# Patient Record
Sex: Male | Born: 1960 | Race: Black or African American | Hispanic: No | Marital: Married | Smoking: Never smoker
Health system: Southern US, Community
[De-identification: ages and names within clinical notes are randomized; demographics above are authoritative.]

## PROBLEM LIST (undated history)

## (undated) DIAGNOSIS — K08109 Complete loss of teeth, unspecified cause, unspecified class: Secondary | ICD-10-CM

## (undated) DIAGNOSIS — E785 Hyperlipidemia, unspecified: Secondary | ICD-10-CM

## (undated) DIAGNOSIS — N210 Calculus in bladder: Secondary | ICD-10-CM

## (undated) DIAGNOSIS — N200 Calculus of kidney: Secondary | ICD-10-CM

## (undated) DIAGNOSIS — Z87442 Personal history of urinary calculi: Secondary | ICD-10-CM

## (undated) DIAGNOSIS — R51 Headache: Secondary | ICD-10-CM

## (undated) DIAGNOSIS — E7800 Pure hypercholesterolemia, unspecified: Secondary | ICD-10-CM

## (undated) DIAGNOSIS — J302 Other seasonal allergic rhinitis: Secondary | ICD-10-CM

## (undated) HISTORY — PX: CARPAL TUNNEL RELEASE: SHX101

## (undated) HISTORY — PX: CIRCUMCISION: SUR203

---

## 2004-01-20 ENCOUNTER — Emergency Department (HOSPITAL_COMMUNITY): Admission: EM | Admit: 2004-01-20 | Discharge: 2004-01-20 | Payer: Self-pay | Admitting: *Deleted

## 2004-07-16 ENCOUNTER — Ambulatory Visit: Payer: Self-pay | Admitting: *Deleted

## 2004-07-16 ENCOUNTER — Ambulatory Visit (HOSPITAL_COMMUNITY): Admission: RE | Admit: 2004-07-16 | Discharge: 2004-07-16 | Payer: Self-pay | Admitting: Family Medicine

## 2005-06-06 ENCOUNTER — Emergency Department (HOSPITAL_COMMUNITY): Admission: EM | Admit: 2005-06-06 | Discharge: 2005-06-06 | Payer: Self-pay | Admitting: Emergency Medicine

## 2006-05-25 ENCOUNTER — Emergency Department (HOSPITAL_COMMUNITY): Admission: EM | Admit: 2006-05-25 | Discharge: 2006-05-25 | Payer: Self-pay | Admitting: Emergency Medicine

## 2006-06-05 ENCOUNTER — Encounter (HOSPITAL_COMMUNITY): Admission: RE | Admit: 2006-06-05 | Discharge: 2006-07-05 | Payer: Self-pay | Admitting: Family Medicine

## 2011-11-11 ENCOUNTER — Other Ambulatory Visit: Payer: Self-pay

## 2011-11-11 ENCOUNTER — Telehealth: Payer: Self-pay

## 2011-11-11 DIAGNOSIS — Z139 Encounter for screening, unspecified: Secondary | ICD-10-CM

## 2011-11-11 NOTE — Telephone Encounter (Signed)
Pt had called and left message for me to call [phone]. Called and [facility] for a return call.

## 2011-11-12 NOTE — Telephone Encounter (Addendum)
Gastroenterology Pre-Procedure Form    Request Date: 11/11/2011      Requesting Physician: Dr. Mirna Mires     PATIENT INFORMATION:  Julian Wilson is a 51 y.o., male (DOB=06/07/1960).  PROCEDURE: Procedure(s) requested: colonoscopy Procedure Reason: screening for colon cancer  PATIENT REVIEW QUESTIONS: The patient reports the following:   1. Diabetes Melitis: no 2. Joint replacements in the past 12 months: no 3. Major health problems in the past 3 months: no 4. Has an artificial valve or MVP:no 5. Has been advised in past to take antibiotics in advance of a procedure like teeth cleaning: no}    MEDICATIONS & ALLERGIES:    Patient reports the following regarding taking any blood thinners:   Plavix? no Aspirin?yes  Coumadin?  no  Patient confirms/reports the following medications:  Current Outpatient Prescriptions  Medication Sig Dispense Refill  . aspirin 81 MG tablet Take 81 mg by mouth daily.      . cetirizine (ZYRTEC) 10 MG tablet Take 10 mg by mouth daily. Pt takes either this or an OTC allergy pill      . simvastatin (ZOCOR) 40 MG tablet Take 40 mg by mouth every evening.        Patient confirms/reports the following allergies:  No Known Allergies  Patient is appropriate to schedule for requested procedure(s): yes  AUTHORIZATION INFORMATION Primary Insurance:   ID #:  Group #:  Pre-Cert / Auth required:  Pre-Cert / Auth #:   Secondary Insurance:   ID #: Group #:  Pre-Cert / Auth required:  Pre-Cert / Auth #:   No orders of the defined types were placed in this encounter.    SCHEDULE INFORMATION: Procedure has been scheduled as follows:  Date: 11/26/2011    Time: 12:30 PM  Location: [facility] Short Stay  This Gastroenterology Pre-Precedure Form is being routed to the following provider(s) for review: R. Roetta Sessions, MD    Rx and instructions mailed to pt.

## 2011-11-12 NOTE — Telephone Encounter (Signed)
OK for colonoscopy.  

## 2011-11-18 ENCOUNTER — Encounter (HOSPITAL_COMMUNITY): Payer: Self-pay | Admitting: Pharmacy Technician

## 2011-11-26 ENCOUNTER — Ambulatory Visit (HOSPITAL_COMMUNITY)
Admission: RE | Admit: 2011-11-26 | Discharge: 2011-11-27 | Disposition: A | Discharge status: Home / Self Care | Payer: PRIVATE HEALTH INSURANCE | Source: Ambulatory Visit | Attending: Internal Medicine | Admitting: Internal Medicine

## 2011-11-26 ENCOUNTER — Encounter (HOSPITAL_COMMUNITY): Payer: Self-pay | Admitting: *Deleted

## 2011-11-26 ENCOUNTER — Encounter (HOSPITAL_COMMUNITY)
Admission: RE | Disposition: A | Discharge status: Home / Self Care | Payer: Self-pay | Source: Ambulatory Visit | Attending: Internal Medicine

## 2011-11-26 DIAGNOSIS — Z1211 Encounter for screening for malignant neoplasm of colon: Secondary | ICD-10-CM

## 2011-11-26 DIAGNOSIS — E7800 Pure hypercholesterolemia, unspecified: Secondary | ICD-10-CM | POA: Insufficient documentation

## 2011-11-26 DIAGNOSIS — Z139 Encounter for screening, unspecified: Secondary | ICD-10-CM

## 2011-11-26 HISTORY — DX: Headache: R51

## 2011-11-26 HISTORY — PX: COLONOSCOPY: SHX5424

## 2011-11-26 HISTORY — DX: Other seasonal allergic rhinitis: J30.2

## 2011-11-26 HISTORY — DX: Pure hypercholesterolemia, unspecified: E78.00

## 2011-11-26 SURGERY — COLONOSCOPY
Anesthesia: Moderate Sedation

## 2011-11-26 MED ORDER — MEPERIDINE HCL 100 MG/ML IJ SOLN
INTRAMUSCULAR | Status: DC | PRN
  Administered 2011-11-26: 50 mg
  Administered 2011-11-26: 25 mg via INTRAVENOUS

## 2011-11-26 MED ORDER — MIDAZOLAM HCL 5 MG/5ML IJ SOLN
INTRAMUSCULAR | Status: AC
  Filled 2011-11-26: qty 10

## 2011-11-26 MED ORDER — STERILE WATER FOR IRRIGATION IR SOLN
Status: DC | PRN
  Administered 2011-11-26: 13:00:00

## 2011-11-26 MED ORDER — SODIUM CHLORIDE 0.45 % IV SOLN
Freq: Once | INTRAVENOUS | Status: AC
  Administered 2011-11-26: 12:00:00 via INTRAVENOUS

## 2011-11-26 MED ORDER — MEPERIDINE HCL 100 MG/ML IJ SOLN
INTRAMUSCULAR | Status: AC
  Filled 2011-11-26: qty 2

## 2011-11-26 MED ORDER — MIDAZOLAM HCL 5 MG/5ML IJ SOLN
INTRAMUSCULAR | Status: DC | PRN
  Administered 2011-11-26: 2 mg via INTRAVENOUS
  Administered 2011-11-26 (×2): 1 mg via INTRAVENOUS

## 2011-11-26 NOTE — H&P (Signed)
  Primary Care Physician:  No primary provider on file. Primary Gastroenterologist:  Dr. Jena Gauss  Pre-Procedure History & Physical: HPI:  Julian Wilson is a 51 y.o. male is here for a screening colonoscopy.  No bowel symptoms. No prior colonoscopy. The family history colon polyps or colon cancer.  Past Medical History  Diagnosis Date  . Hypercholesteremia   . Seasonal allergies   . Headache     Past Surgical History  Procedure Date  . Circumcision     Prior to Admission medications   Medication Sig Start Date End Date Taking? Authorizing Provider  aspirin 81 MG tablet Take 81 mg by mouth daily.   Yes Historical Provider, MD  cetirizine (ZYRTEC) 10 MG tablet Take 10 mg by mouth daily. Pt takes either this or an OTC allergy pill   Yes Historical Provider, MD  simvastatin (ZOCOR) 40 MG tablet Take 40 mg by mouth every evening.   Yes Historical Provider, MD    Allergies as of 11/11/2011  . (No Known Allergies)    Family History  Problem Relation Age of Onset  . Colon cancer Neg Hx     History   Social History  . Marital Status: Married    Spouse Name: N/A    Number of Children: N/A  . Years of Education: N/A   Occupational History  . Not on file.   Social History Main Topics  . Smoking status: Never Smoker   . Smokeless tobacco: Not on file  . Alcohol Use: No  . Drug Use: No  . Sexually Active:    Other Topics Concern  . Not on file   Social History Narrative  . No narrative on file    Review of Systems: See HPI, otherwise negative ROS  Physical Exam: BP 126/71  Pulse 59  Temp 98.3 F (36.8 C) (Oral)  Resp 22  Ht 5\' 5"  (1.651 m)  Wt 150 lb (68.04 kg)  BMI 24.96 kg/m2  SpO2 99% General:   Alert,  Well-developed, well-nourished, pleasant and cooperative in NAD Head:  Normocephalic and atraumatic. Eyes:  Sclera clear, no icterus.   Conjunctiva pink. Ears:  Normal auditory acuity. Nose:  No deformity, discharge,  or lesions. Mouth:  No deformity  or lesions, dentition normal. Neck:  Supple; no masses or thyromegaly. Lungs:  Clear throughout to auscultation.   No wheezes, crackles, or rhonchi. No acute distress. Heart:  Regular rate and rhythm; no murmurs, clicks, rubs,  or gallops. Abdomen:  Soft, nontender and nondistended. No masses, hepatosplenomegaly or hernias noted. Normal bowel sounds, without guarding, and without rebound.   Msk:  Symmetrical without gross deformities. Normal posture. Pulses:  Normal pulses noted. Extremities:  Without clubbing or edema. Neurologic:  Alert and  oriented x4;  grossly normal neurologically. Skin:  Intact without significant lesions or rashes. Cervical Nodes:  No significant cervical adenopathy. Psych:  Alert and cooperative. Normal mood and affect.  Impression/Plan: Julian Wilson is now here to undergo a screening colonoscopy. First-ever average risk screening examination.  Risks, benefits, limitations, imponderables and alternatives regarding colonoscopy have been reviewed with the patient. Questions have been answered. All parties agreeable.

## 2011-11-26 NOTE — Op Note (Signed)
[facility] [address] [place], [place]  [address]  COLONOSCOPY PROCEDURE REPORT  PATIENT:  Julian Wilson, Julian Wilson  MR#:  [id number] BIRTHDATE:  07-29-1960, 50 yrs. old  GENDER:  male ENDOSCOPIST:  R. Roetta Sessions, MD FACP [facility] REF. BY:  Mirna Mires, M.D. PROCEDURE DATE:  11/26/2011 PROCEDURE:  Screening colonoscopy  INDICATIONS:  First-ever average risk screening examination.  INFORMED CONSENT:  The risks, benefits, alternatives and imponderables including but not limited to bleeding, perforation as well as the possibility of a missed lesion have been reviewed. The potential for biopsy, lesion removal, etc. have also been discussed.  Questions have been answered.  All parties agreeable. Please see the history and physical in the medical record for more information.  MEDICATIONS:  Versed 4 mg IV and Demerol 75 mg IV in divided doses. Cetacaine spray  DESCRIPTION OF PROCEDURE:  After a digital rectal exam was performed, the EC-[id number] ([id number]) colonoscope was advanced from the anus through the rectum and colon to the area of the cecum, ileocecal valve and appendiceal orifice.  The cecum was deeply intubated.  These structures were well-seen and photographed for the record.  From the level of the cecum and ileocecal valve, the scope was slowly and cautiously withdrawn.  The mucosal surfaces were carefully surveyed utilizing scope tip deflection to facilitate fold flattening as needed.  The scope was pulled down into the rectum where a thorough examination including retroflexion was performed. <<PROCEDUREIMAGES>>  FINDINGS: Adequate preparation. Normal rectum. Normal colonic mucosa.  THERAPEUTIC / DIAGNOSTIC MANEUVERS PERFORMED: None  COMPLICATIONS:  None  CECAL WITHDRAWAL TIME: 9 minutes  IMPRESSION: Normal rectum and colon  RECOMMENDATIONS:  Repeat screening colonoscopy in 10 years  ______________________________ R. Roetta Sessions, MD Caleen Essex  CC:  Mirna Mires, M.D.  n. eSIGNED:   R. Roetta Sessions at 11/26/2011 01:55 PM  Marcille Buffy, [phone]

## 2011-11-26 NOTE — H&P (Signed)
EGD Discharge instructions Please read the instructions outlined below and refer to this sheet in the next few weeks. These discharge instructions provide you with general information on caring for yourself after you leave the hospital. Your doctor may also give you specific instructions. While your treatment has been planned according to the most current medical practices available, unavoidable complications occasionally occur. If you have any problems or questions after discharge, please call your doctor. ACTIVITY You may resume your regular activity but move at a slower pace for the next 24 hours.  Take frequent rest periods for the next 24 hours.  Walking will help expel (get rid of) the air and reduce the bloated feeling in your abdomen.  No driving for 24 hours (because of the anesthesia (medicine) used during the test).  You may shower.  Do not sign any important legal documents or operate any machinery for 24 hours (because of the anesthesia used during the test).  NUTRITION Drink plenty of fluids.  You may resume your normal diet.  Begin with a light meal and progress to your normal diet.  Avoid alcoholic beverages for 24 hours or as instructed by your caregiver.  MEDICATIONS You may resume your normal medications unless your caregiver tells you otherwise.  WHAT YOU CAN EXPECT TODAY You may experience abdominal discomfort such as a feeling of fullness or "gas" pains.  FOLLOW-UP Your doctor will discuss the results of your test with you.  SEEK IMMEDIATE MEDICAL ATTENTION IF ANY OF THE FOLLOWING OCCUR: Excessive nausea (feeling sick to your stomach) and/or vomiting.  Severe abdominal pain and distention (swelling).  Trouble swallowing.  Temperature over 101 F (37.8 C).  Rectal bleeding or vomiting of blood.      

## 2011-12-03 ENCOUNTER — Encounter (HOSPITAL_COMMUNITY): Payer: Self-pay | Admitting: Internal Medicine

## 2013-04-11 ENCOUNTER — Emergency Department (HOSPITAL_COMMUNITY): Payer: PRIVATE HEALTH INSURANCE

## 2013-04-11 ENCOUNTER — Emergency Department (HOSPITAL_COMMUNITY)
Admission: EM | Admit: 2013-04-11 | Discharge: 2013-04-12 | Disposition: A | Discharge status: Home / Self Care | Payer: PRIVATE HEALTH INSURANCE | Attending: Emergency Medicine | Admitting: Emergency Medicine

## 2013-04-11 ENCOUNTER — Encounter (HOSPITAL_COMMUNITY): Payer: Self-pay | Admitting: Emergency Medicine

## 2013-04-11 DIAGNOSIS — Z7982 Long term (current) use of aspirin: Secondary | ICD-10-CM | POA: Insufficient documentation

## 2013-04-11 DIAGNOSIS — N201 Calculus of ureter: Secondary | ICD-10-CM

## 2013-04-11 DIAGNOSIS — R112 Nausea with vomiting, unspecified: Secondary | ICD-10-CM | POA: Insufficient documentation

## 2013-04-11 DIAGNOSIS — Z87442 Personal history of urinary calculi: Secondary | ICD-10-CM | POA: Insufficient documentation

## 2013-04-11 DIAGNOSIS — Z79899 Other long term (current) drug therapy: Secondary | ICD-10-CM | POA: Insufficient documentation

## 2013-04-11 DIAGNOSIS — E7800 Pure hypercholesterolemia, unspecified: Secondary | ICD-10-CM | POA: Insufficient documentation

## 2013-04-11 HISTORY — DX: Calculus of kidney: N20.0

## 2013-04-11 LAB — CBC WITH DIFFERENTIAL/PLATELET
Eosinophils Relative: 2 % (ref 0–5)
HCT: 40.2 % (ref 39.0–52.0)
Lymphocytes Relative: 23 % (ref 12–46)
Lymphs Abs: 1.9 10*3/uL (ref 0.7–4.0)
Monocytes Absolute: 0.7 10*3/uL (ref 0.1–1.0)
Neutro Abs: 5.5 10*3/uL (ref 1.7–7.7)
Platelets: 220 10*3/uL (ref 150–400)
WBC: 8.2 10*3/uL (ref 4.0–10.5)

## 2013-04-11 LAB — URINALYSIS, ROUTINE W REFLEX MICROSCOPIC
Protein, ur: NEGATIVE mg/dL
Urobilinogen, UA: 0.2 mg/dL (ref 0.0–1.0)

## 2013-04-11 MED ORDER — SODIUM CHLORIDE 0.9 % IV SOLN
Freq: Once | INTRAVENOUS | Status: AC
  Administered 2013-04-11: via INTRAVENOUS

## 2013-04-11 MED ORDER — ONDANSETRON HCL 4 MG/2ML IJ SOLN
4.0000 mg | Freq: Once | INTRAMUSCULAR | Status: AC
  Administered 2013-04-11: 4 mg via INTRAVENOUS
  Filled 2013-04-11: qty 2

## 2013-04-11 MED ORDER — HYDROMORPHONE HCL PF 1 MG/ML IJ SOLN
1.0000 mg | Freq: Once | INTRAMUSCULAR | Status: AC
  Administered 2013-04-11: 1 mg via INTRAVENOUS
  Filled 2013-04-11: qty 1

## 2013-04-11 NOTE — ED Provider Notes (Signed)
CSN: [id number]     Arrival date & time 04/11/13  2121 History   First MD Initiated Contact with Patient 04/11/13 2312     Chief Complaint  Patient presents with  . Flank Pain   (Consider location/radiation/quality/duration/timing/severity/associated sxs/prior Treatment) Patient is a 52 y.o. male presenting with flank pain. The history is provided by the patient.  Flank Pain This is a new problem. The current episode started yesterday. The problem occurs constantly. The problem has been gradually worsening. Associated symptoms include abdominal pain, nausea, urinary symptoms and vomiting. Pertinent negatives include no change in bowel habit, chest pain, chills, coughing, fever, joint swelling, neck pain, numbness, rash or sore throat. Nothing aggravates the symptoms. He has tried nothing for the symptoms. The treatment provided no relief.   Patient reports sudden onset of right flank pain that began yesterday, but has worsen today.  Describes a sharp pain to his right flank that radiates to the right lower abdomen and groin.  He states the pain feels seems similar to previous kidney stones.     Past Medical History  Diagnosis Date  . Hypercholesteremia   . Seasonal allergies   . Headache(784.0)   . Kidney stones    Past Surgical History  Procedure Laterality Date  . Circumcision    . Colonoscopy  11/26/2011    Procedure: COLONOSCOPY;  Surgeon: Corbin Ade, MD;  Location: [facility];  Service: Endoscopy;  Laterality: N/A;  12:30 PM   Family History  Problem Relation Age of Onset  . Colon cancer Neg Hx    History  Substance Use Topics  . Smoking status: Never Smoker   . Smokeless tobacco: Not on file  . Alcohol Use: No    Review of Systems  Constitutional: Negative for fever, chills, activity change and appetite change.  HENT: Negative for sore throat.   Respiratory: Negative for cough and chest tightness.   Cardiovascular: Negative for chest pain.  Gastrointestinal:  Positive for nausea, vomiting and abdominal pain. Negative for diarrhea, abdominal distention and change in bowel habit.  Genitourinary: Positive for flank pain. Negative for dysuria, hematuria, decreased urine volume, penile swelling, scrotal swelling, difficulty urinating and penile pain.  Musculoskeletal: Negative for joint swelling and neck pain.  Skin: Negative for rash.  Neurological: Negative for numbness.  All other systems reviewed and are negative.    Allergies  Review of patient's allergies indicates no known allergies.  Home Medications   Current Outpatient Rx  Name  Route  Sig  Dispense  Refill  . aspirin 81 MG tablet   Oral   Take 81 mg by mouth daily.         . cetirizine (ZYRTEC) 10 MG tablet   Oral   Take 10 mg by mouth daily. Pt takes either this or an OTC allergy pill         . simvastatin (ZOCOR) 40 MG tablet   Oral   Take 40 mg by mouth every evening.          BP 126/66  Pulse 72  Temp(Src) 98.3 F (36.8 C) (Oral)  Resp 20  Ht 5\' 5"  (1.651 m)  Wt 179 lb (81.194 kg)  BMI 29.79 kg/m2  SpO2 100%   Physical Exam  Nursing note and vitals reviewed. Constitutional: He is oriented to person, place, and time. He appears well-developed and well-nourished.  Uncomfortable appearing  HENT:  Head: Normocephalic and atraumatic.  Mouth/Throat: Oropharynx is clear and moist.  Neck: Normal range of motion.  Neck supple.  Cardiovascular: Normal rate, regular rhythm, normal heart sounds and intact distal pulses.   No murmur heard. Pulmonary/Chest: Effort normal and breath sounds normal. No respiratory distress. He exhibits no tenderness.  Abdominal: Soft. He exhibits no distension and no mass. There is no hepatosplenomegaly. There is tenderness in the suprapubic area. There is CVA tenderness. There is no rigidity, no rebound, no guarding and no tenderness at McBurney's point.  Musculoskeletal: Normal range of motion.  Neurological: He is alert and oriented  to person, place, and time. He exhibits normal muscle tone. Coordination normal.  Skin: Skin is warm and dry.    ED Course  Procedures (including critical care time) Labs Review Labs Reviewed  BASIC METABOLIC PANEL - Abnormal; Notable for the following:    Glucose, Bld 129 (*)    GFR calc non Af Amer 73 (*)    GFR calc Af Amer 84 (*)    All other components within normal limits  URINALYSIS, ROUTINE W REFLEX MICROSCOPIC - Abnormal; Notable for the following:    Specific Gravity, Urine >1.030 (*)    Hgb urine dipstick LARGE (*)    All other components within normal limits  CBC WITH DIFFERENTIAL  URINE MICROSCOPIC-ADD ON   Imaging Review Ct Abdomen Pelvis Wo Contrast  04/12/2013   CLINICAL DATA:  Right sided pain extending to right lower abdomen. History of kidney stones.  EXAM: CT ABDOMEN AND PELVIS WITHOUT CONTRAST  TECHNIQUE: Multidetector CT imaging of the abdomen and pelvis was performed following the standard protocol without intravenous contrast.  COMPARISON:  CT of the abdomen and pelvis February 03, 2006  FINDINGS: Included view of the lung bases are clear. The heart appears mildly enlarged, pericardium is unremarkable.  Kidneys are orthotopic, demonstrating normal size and morphology. Mild right hydroureteronephrosis to the level of the mid right ureter where a 4 x 5 mm calculus is seen. No residual nephrolithiasis. No left hydronephrosis. Without nephrolithiasis, hydronephrosis; limited assessment for renal masses on this nonenhanced examination. The unopacified ureters are normal in course and caliber. Urinary bladder is partially distended and unremarkable.  Within segment 4B of the liver is 3.4 x 3.4 cm lobulated hypodense mass, previously 1.7 x 1.5 cm. Spleen, gallbladder, pancreas and adrenal glands are unremarkable for this non-contrast examination.  The stomach, small and large bowel are normal in course and caliber without inflammatory changes, the sensitivity may be decreased  by lack of enteric contrast. Moderate amount of retained large bowel stool. No intraperitoneal free fluid nor free air.  Great vessels are normal in course and caliber. No lymphadenopathy by CT size criteria ; sub cm portacaval lymph nodes. Internal reproductive organs are unremarkable. The soft tissues and included osseous structures are nonsuspicious.  IMPRESSION: Mild right hydroureteronephrosis to the level of the mid right ureter where a 4 x 5 mm calculus is seen. No residual urolithiasis.  Interval increase in size of segment 4B hepatic mass (previously 1.7 x 1.57 matter is, now 3.4 x 3.4 cm), recommend an MR liver mass protocol on a nonemergent basis.   Electronically Signed   By: Awilda Metro   On: 04/12/2013 00:37    EKG Interpretation   None       MDM   Medications  ketorolac (TORADOL) 30 MG/ML injection 30 mg (not administered)  ketorolac (TORADOL) 30 MG/ML injection (not administered)  0.9 %  sodium chloride infusion ( Intravenous New Bag/Given 04/11/13 2344)  HYDROmorphone (DILAUDID) injection 1 mg (1 mg Intravenous Given 04/11/13 2346)  ondansetron [facility]) injection 4 mg (4 mg Intravenous Given 04/11/13 2345)    0020  Patient is feeling better.  Pain resolved.  Awaiting CT.  Labs wnml except hematuria, no infected stone  Discussed CT findings with patient.  Agrees to close f/u with his PMD regarding the liver mass.  Will give referral to Dr. Jerre Simon, urine strainer dispensed.  Patient is pain free at present.  VSS.  Patient appears stable for discharge and agrees to care plan       Kjuan Seipp L. Trisha Mangle, PA-C 04/12/13 0129

## 2013-04-11 NOTE — ED Notes (Signed)
Hurting in right side and going into right lower abdomen per pt. Can't sit down or be still from hurting so bad. Started yesterday per pt. History of kidney stones. Feels the same.

## 2013-04-12 LAB — BASIC METABOLIC PANEL
BUN: 15 mg/dL (ref 6–23)
CO2: 24 mEq/L (ref 19–32)
Calcium: 9.4 mg/dL (ref 8.4–10.5)
Creatinine, Ser: 1.13 mg/dL (ref 0.50–1.35)
GFR calc Af Amer: 84 mL/min — ABNORMAL LOW (ref >90)
Glucose, Bld: 129 mg/dL — ABNORMAL HIGH (ref 70–99)

## 2013-04-12 MED ORDER — OXYCODONE-ACETAMINOPHEN 5-325 MG PO TABS: 1.0000 | ORAL_TABLET | ORAL | Status: DC | PRN

## 2013-04-12 MED ORDER — KETOROLAC TROMETHAMINE 30 MG/ML IJ SOLN
INTRAMUSCULAR | Status: AC
  Filled 2013-04-12: qty 1

## 2013-04-12 MED ORDER — KETOROLAC TROMETHAMINE 30 MG/ML IJ SOLN
30.0000 mg | Freq: Once | INTRAMUSCULAR | Status: AC
  Administered 2013-04-12: 30 mg via INTRAVENOUS

## 2013-04-12 MED ORDER — ONDANSETRON HCL 4 MG PO TABS: 4.0000 mg | ORAL_TABLET | Freq: Four times a day (QID) | ORAL | Status: DC

## 2013-04-12 NOTE — ED Provider Notes (Signed)
Pt is a 52 year old male,presents with c/o pain in the abd - has improved on my exam after he received IV pain meds - had asocaited nausea but denies gross hematuria. On my exam has soft and non tender abd - UA and labs reviewed - ormal renal function and UA without infectio - CT reviewed with pt and family - agreeable to MRI as outpt.  Medical screening examination/treatment/procedure(s) were conducted as a shared visit with non-physician practitioner(s) and myself. I personally evaluated the patient during the encounter.  Clinical Impression:  #1 - Kidney stone  #2 - Liver mass   Vida Roller, MD 04/12/13 [phone]

## 2013-04-12 NOTE — ED Provider Notes (Signed)
Pt is a 52 year old male,presents with c/o pain in the abd - has improved on my exam after he received IV pain meds - had asocaited nausea but denies gross hematuria.  On my exam has soft and non tender abd - UA and labs reviewed - ormal renal function and UA without infectio - CT reviewed with pt and family - agreeable to MRI as outpt.  Medical screening examination/treatment/procedure(s) were conducted as a shared visit with non-physician practitioner(s) and myself.  I personally evaluated the patient during the encounter.  Clinical Impression:   #1 - Kidney stone #2 - Liver mass     Vida Roller, MD 04/12/13 [phone]

## 2013-04-14 MED FILL — Oxycodone w/ Acetaminophen Tab 5-325 MG: ORAL | Qty: 6 | Status: AC

## 2013-04-21 ENCOUNTER — Other Ambulatory Visit (HOSPITAL_COMMUNITY): Payer: Self-pay | Admitting: Family Medicine

## 2013-04-21 DIAGNOSIS — IMO0002 Reserved for concepts with insufficient information to code with codable children: Secondary | ICD-10-CM

## 2013-04-21 DIAGNOSIS — R229 Localized swelling, mass and lump, unspecified: Principal | ICD-10-CM

## 2013-04-27 ENCOUNTER — Ambulatory Visit (HOSPITAL_COMMUNITY)
Admission: RE | Admit: 2013-04-27 | Discharge: 2013-04-27 | Disposition: A | Discharge status: Home / Self Care | Payer: PRIVATE HEALTH INSURANCE | Source: Ambulatory Visit | Attending: Family Medicine | Admitting: Family Medicine

## 2013-04-27 ENCOUNTER — Encounter (HOSPITAL_COMMUNITY): Payer: Self-pay

## 2013-04-27 DIAGNOSIS — R599 Enlarged lymph nodes, unspecified: Secondary | ICD-10-CM | POA: Insufficient documentation

## 2013-04-27 DIAGNOSIS — IMO0002 Reserved for concepts with insufficient information to code with codable children: Secondary | ICD-10-CM

## 2013-04-27 DIAGNOSIS — K769 Liver disease, unspecified: Secondary | ICD-10-CM | POA: Insufficient documentation

## 2013-04-27 DIAGNOSIS — R229 Localized swelling, mass and lump, unspecified: Secondary | ICD-10-CM

## 2013-04-27 MED ORDER — GADOBENATE DIMEGLUMINE 529 MG/ML IV SOLN
15.0000 mL | Freq: Once | INTRAVENOUS | Status: AC | PRN
  Administered 2013-04-27: 15 mL via INTRAVENOUS

## 2013-04-29 ENCOUNTER — Other Ambulatory Visit (HOSPITAL_COMMUNITY): Payer: Self-pay | Admitting: Urology

## 2013-04-29 DIAGNOSIS — N23 Unspecified renal colic: Secondary | ICD-10-CM

## 2013-05-05 ENCOUNTER — Ambulatory Visit (HOSPITAL_COMMUNITY)
Admission: RE | Admit: 2013-05-05 | Discharge: 2013-05-05 | Disposition: A | Discharge status: Home / Self Care | Payer: PRIVATE HEALTH INSURANCE | Source: Ambulatory Visit | Attending: Urology | Admitting: Urology

## 2013-05-05 DIAGNOSIS — N201 Calculus of ureter: Secondary | ICD-10-CM | POA: Insufficient documentation

## 2013-05-05 DIAGNOSIS — D1803 Hemangioma of intra-abdominal structures: Secondary | ICD-10-CM | POA: Insufficient documentation

## 2013-05-05 DIAGNOSIS — N23 Unspecified renal colic: Secondary | ICD-10-CM

## 2013-05-05 DIAGNOSIS — N2889 Other specified disorders of kidney and ureter: Secondary | ICD-10-CM | POA: Insufficient documentation

## 2013-05-05 DIAGNOSIS — R109 Unspecified abdominal pain: Secondary | ICD-10-CM | POA: Insufficient documentation

## 2013-12-14 ENCOUNTER — Ambulatory Visit (INDEPENDENT_AMBULATORY_CARE_PROVIDER_SITE_OTHER): Payer: PRIVATE HEALTH INSURANCE | Admitting: Urology

## 2013-12-14 ENCOUNTER — Ambulatory Visit (HOSPITAL_COMMUNITY)
Admission: RE | Admit: 2013-12-14 | Discharge: 2013-12-14 | Disposition: A | Discharge status: Home / Self Care | Payer: PRIVATE HEALTH INSURANCE | Source: Ambulatory Visit | Attending: Urology | Admitting: Urology

## 2013-12-14 ENCOUNTER — Other Ambulatory Visit: Payer: Self-pay | Admitting: Urology

## 2013-12-14 DIAGNOSIS — N201 Calculus of ureter: Secondary | ICD-10-CM | POA: Diagnosis not present

## 2013-12-14 DIAGNOSIS — R972 Elevated prostate specific antigen [PSA]: Secondary | ICD-10-CM

## 2013-12-14 DIAGNOSIS — N4340 Spermatocele of epididymis, unspecified: Secondary | ICD-10-CM

## 2014-03-29 ENCOUNTER — Ambulatory Visit (INDEPENDENT_AMBULATORY_CARE_PROVIDER_SITE_OTHER): Payer: PRIVATE HEALTH INSURANCE | Admitting: Urology

## 2014-03-29 DIAGNOSIS — N201 Calculus of ureter: Secondary | ICD-10-CM

## 2014-03-29 DIAGNOSIS — R972 Elevated prostate specific antigen [PSA]: Secondary | ICD-10-CM

## 2018-12-18 ENCOUNTER — Encounter (HOSPITAL_COMMUNITY): Payer: Self-pay

## 2018-12-18 ENCOUNTER — Other Ambulatory Visit: Payer: Self-pay

## 2018-12-18 ENCOUNTER — Emergency Department (HOSPITAL_COMMUNITY): Payer: PRIVATE HEALTH INSURANCE

## 2018-12-18 ENCOUNTER — Emergency Department (HOSPITAL_COMMUNITY)
Admission: EM | Admit: 2018-12-18 | Discharge: 2018-12-18 | Disposition: A | Discharge status: Home / Self Care | Payer: PRIVATE HEALTH INSURANCE | Attending: Emergency Medicine | Admitting: Emergency Medicine

## 2018-12-18 DIAGNOSIS — Z79899 Other long term (current) drug therapy: Secondary | ICD-10-CM | POA: Insufficient documentation

## 2018-12-18 DIAGNOSIS — N201 Calculus of ureter: Secondary | ICD-10-CM | POA: Insufficient documentation

## 2018-12-18 DIAGNOSIS — Z7982 Long term (current) use of aspirin: Secondary | ICD-10-CM | POA: Insufficient documentation

## 2018-12-18 DIAGNOSIS — N23 Unspecified renal colic: Secondary | ICD-10-CM

## 2018-12-18 DIAGNOSIS — R1030 Lower abdominal pain, unspecified: Secondary | ICD-10-CM | POA: Diagnosis present

## 2018-12-18 LAB — CBC WITH DIFFERENTIAL/PLATELET
Abs Immature Granulocytes: 0.02 10*3/uL (ref 0.00–0.07)
Basophils Absolute: 0 10*3/uL (ref 0.0–0.1)
Basophils Relative: 1 %
Eosinophils Absolute: 0.1 10*3/uL (ref 0.0–0.5)
Eosinophils Relative: 1 %
HCT: 44.5 % (ref 39.0–52.0)
Hemoglobin: 14.8 g/dL (ref 13.0–17.0)
Immature Granulocytes: 0 %
Lymphocytes Relative: 18 %
Lymphs Abs: 1.4 10*3/uL (ref 0.7–4.0)
MCH: 29.7 pg (ref 26.0–34.0)
MCHC: 33.3 g/dL (ref 30.0–36.0)
MCV: 89.2 fL (ref 80.0–100.0)
Monocytes Absolute: 0.6 10*3/uL (ref 0.1–1.0)
Monocytes Relative: 7 %
Neutro Abs: 5.8 10*3/uL (ref 1.7–7.7)
Neutrophils Relative %: 73 %
Platelets: 242 10*3/uL (ref 150–400)
RBC: 4.99 MIL/uL (ref 4.22–5.81)
RDW: 11.9 % (ref 11.5–15.5)
WBC: 7.9 10*3/uL (ref 4.0–10.5)
nRBC: 0 % (ref 0.0–0.2)

## 2018-12-18 LAB — BASIC METABOLIC PANEL
Anion gap: 9 (ref 5–15)
BUN: 14 mg/dL (ref 6–20)
CO2: 25 mmol/L (ref 22–32)
Calcium: 9.2 mg/dL (ref 8.9–10.3)
Chloride: 102 mmol/L (ref 98–111)
Creatinine, Ser: 1.02 mg/dL (ref 0.61–1.24)
GFR calc Af Amer: >60 mL/min (ref >60)
GFR calc non Af Amer: >60 mL/min (ref >60)
Glucose, Bld: 117 mg/dL — ABNORMAL HIGH (ref 70–99)
Potassium: 3.7 mmol/L (ref 3.5–5.1)
Sodium: 136 mmol/L (ref 135–145)

## 2018-12-18 LAB — URINALYSIS, ROUTINE W REFLEX MICROSCOPIC
Bacteria, UA: NONE SEEN
Bilirubin Urine: NEGATIVE
Glucose, UA: NEGATIVE mg/dL
Ketones, ur: NEGATIVE mg/dL
Leukocytes,Ua: NEGATIVE
Nitrite: NEGATIVE
Protein, ur: 30 mg/dL — AB
RBC / HPF: >50 RBC/hpf — ABNORMAL HIGH (ref 0–5)
Specific Gravity, Urine: 1.017 (ref 1.005–1.030)
pH: 6 (ref 5.0–8.0)

## 2018-12-18 MED ORDER — TAMSULOSIN HCL 0.4 MG PO CAPS: 0.4000 mg | ORAL_CAPSULE | Freq: Every day | ORAL | 0 refills | Status: DC

## 2018-12-18 MED ORDER — IOHEXOL 300 MG/ML  SOLN
100.0000 mL | Freq: Once | INTRAMUSCULAR | Status: AC | PRN
  Administered 2018-12-18: 07:00:00 100 mL via INTRAVENOUS

## 2018-12-18 MED ORDER — ONDANSETRON 4 MG PO TBDP: 4.0000 mg | ORAL_TABLET | Freq: Three times a day (TID) | ORAL | 0 refills | Status: DC | PRN

## 2018-12-18 MED ORDER — SODIUM CHLORIDE 0.9 % IV BOLUS
1000.0000 mL | Freq: Once | INTRAVENOUS | Status: AC
  Administered 2018-12-18: 07:00:00 1000 mL via INTRAVENOUS

## 2018-12-18 MED ORDER — OXYCODONE-ACETAMINOPHEN 5-325 MG PO TABS: ORAL_TABLET | ORAL | 0 refills | Status: DC

## 2018-12-18 MED ORDER — MORPHINE SULFATE (PF) 4 MG/ML IV SOLN: 8.0000 mg | INTRAVENOUS | Status: DC | PRN

## 2018-12-18 MED ORDER — ONDANSETRON HCL 4 MG/2ML IJ SOLN: 4.0000 mg | Freq: Four times a day (QID) | INTRAMUSCULAR | Status: DC | PRN

## 2018-12-18 NOTE — ED Notes (Signed)
Patient tolerating fluids. 

## 2018-12-18 NOTE — ED Provider Notes (Signed)
Pt received at change of shift with CT A/P pending. See previous EDP note for full HPI/H&P/MDM. 58yo M, c/o right abd pain radiating into groin. Hx kidney stones.  Labs reassuring. CT +UVJ stone. Pt has tol PO well while in the ED without N/V.  Feels better and wants to go home now. Tx symptomatically, f/u Uro MD. Dx and testing, as well as incidental finding(s), d/w pt and family.  Questions answered.  Verb understanding, agreeable to d/c home with outpt f/u.   BP 129/80 (BP Location: Right Arm)   Pulse (!) 57   Temp 98.1 F (36.7 C) (Oral)   Resp 17   Ht 5\' 5"  (1.651 m)   Wt 77.6 kg   SpO2 97%   BMI 28.46 kg/m    Results for orders placed or performed during the hospital encounter of 12/18/18  CBC with Differential  Result Value Ref Range   WBC 7.9 4.0 - 10.5 K/uL   RBC 4.99 4.22 - 5.81 MIL/uL   Hemoglobin 14.8 13.0 - 17.0 g/dL   HCT 44.5 39.0 - 52.0 %   MCV 89.2 80.0 - 100.0 fL   MCH 29.7 26.0 - 34.0 pg   MCHC 33.3 30.0 - 36.0 g/dL   RDW 11.9 11.5 - 15.5 %   Platelets 242 150 - 400 K/uL   nRBC 0.0 0.0 - 0.2 %   Neutrophils Relative % 73 %   Neutro Abs 5.8 1.7 - 7.7 K/uL   Lymphocytes Relative 18 %   Lymphs Abs 1.4 0.7 - 4.0 K/uL   Monocytes Relative 7 %   Monocytes Absolute 0.6 0.1 - 1.0 K/uL   Eosinophils Relative 1 %   Eosinophils Absolute 0.1 0.0 - 0.5 K/uL   Basophils Relative 1 %   Basophils Absolute 0.0 0.0 - 0.1 K/uL   Immature Granulocytes 0 %   Abs Immature Granulocytes 0.02 0.00 - 0.07 K/uL  Basic metabolic panel  Result Value Ref Range   Sodium 136 135 - 145 mmol/L   Potassium 3.7 3.5 - 5.1 mmol/L   Chloride 102 98 - 111 mmol/L   CO2 25 22 - 32 mmol/L   Glucose, Bld 117 (H) 70 - 99 mg/dL   BUN 14 6 - 20 mg/dL   Creatinine, Ser 1.02 0.61 - 1.24 mg/dL   Calcium 9.2 8.9 - 10.3 mg/dL   GFR calc non Af Amer >60 >60 mL/min   GFR calc Af Amer >60 >60 mL/min   Anion gap 9 5 - 15  Urinalysis, Routine w reflex microscopic  Result Value Ref Range   Color,  Urine YELLOW YELLOW   APPearance CLEAR CLEAR   Specific Gravity, Urine 1.017 1.005 - 1.030   pH 6.0 5.0 - 8.0   Glucose, UA NEGATIVE NEGATIVE mg/dL   Hgb urine dipstick LARGE (A) NEGATIVE   Bilirubin Urine NEGATIVE NEGATIVE   Ketones, ur NEGATIVE NEGATIVE mg/dL   Protein, ur 30 (A) NEGATIVE mg/dL   Nitrite NEGATIVE NEGATIVE   Leukocytes,Ua NEGATIVE NEGATIVE   RBC / HPF >50 (H) 0 - 5 RBC/hpf   WBC, UA 0-5 0 - 5 WBC/hpf   Bacteria, UA NONE SEEN NONE SEEN   Mucus PRESENT    Hyaline Casts, UA PRESENT    Ct Abdomen Pelvis W Contrast Result Date: 12/18/2018 CLINICAL DATA:  Abdominal pain EXAM: CT ABDOMEN AND PELVIS WITH CONTRAST TECHNIQUE: Multidetector CT imaging of the abdomen and pelvis was performed using the standard protocol following bolus administration of intravenous contrast. CONTRAST:  131mL OMNIPAQUE IOHEXOL 300 MG/ML  SOLN COMPARISON:  05/05/2013 CT, 04/27/2013 MRI FINDINGS: Lower chest: No acute abnormality. Hepatobiliary: Mild fatty infiltration of the liver is noted. There is a 4 cm lesion within the medial segment of the left lobe of the liver which demonstrates nodular peripheral enhancement consistent with a hepatic hemangioma. This is stable in appearance from a prior MRI and CT examination from 2015. Remainder of the liver is within normal limits. Gallbladder is unremarkable. Pancreas: Unremarkable. No pancreatic ductal dilatation or surrounding inflammatory changes. Spleen: Normal in size without focal abnormality. Adrenals/Urinary Tract: Adrenal glands are within normal limits bilaterally. Kidneys demonstrate a normal enhancement pattern. No renal calculi are seen. Fullness of the right renal collecting system and right ureter is noted to the level of the ureterovesical junction. A 4 mm stone is noted at the right UVJ causing the obstructive change. The bladder is partially distended. Stomach/Bowel: No obstructive or inflammatory changes of the colon are seen. The appendix is well  visualized and within normal limits. The small bowel and stomach show no focal abnormality. Vascular/Lymphatic: No significant vascular findings are present. No enlarged abdominal or pelvic lymph nodes. Reproductive: Prostate is unremarkable. Other: No abdominal wall hernia or abnormality. No abdominopelvic ascites. Musculoskeletal: No acute or significant osseous findings. IMPRESSION: 4 mm right UVJ stone with mild obstructive changes. Stable hemangioma in the left lobe of the liver. Electronically Signed   By: Inez Catalina M.D.   On: 12/18/2018 07:39        Francine Graven, DO 12/18/18 [phone]

## 2018-12-18 NOTE — ED Triage Notes (Signed)
Pt states he awoke with lower abd pain.  Pt states he had 2 bm's prior to coming to the hospital.  Pt denies n/v

## 2018-12-18 NOTE — Discharge Instructions (Addendum)
Take the prescriptions as directed.  Also take over the counter ibuprofen, 2 tablets by mouth every 4 hours with food, for the next week.  Call the Urologist today to schedule a follow up appointment next week.  Return to the Emergency Department immediately if worsening.

## 2018-12-18 NOTE — ED Provider Notes (Signed)
[facility] EMERGENCY DEPARTMENT Provider Note   CSN: [id number] Arrival date & time: 12/18/18  0545     History   Chief Complaint Chief Complaint  Patient presents with  . Abdominal Pain    HPI Julian Wilson is a 58 y.o. male.     HPI 58 year old male with history of kidney stones and high cholesterol comes in a chief complaint of abdominal pain.  He reports that the pain woke him up about 2:00 in the morning.  Pain is located in the lower part of his abdomen and radiating to his groin.  The pain is constant and not waxing and waning in intensity.  The pain is stopped once he came to the ER.  At its worst the pain was 10 out of 10, now it is 5 out of 10.  There is no associated nausea, vomiting, diarrhea, bloody stools, dysuria, hematuria, urinary frequency or urgency.   Past Medical History:  Diagnosis Date  . Headache(784.0)   . Hypercholesteremia   . Kidney stones   . Seasonal allergies     There are no active problems to display for this patient.   Past Surgical History:  Procedure Laterality Date  . CIRCUMCISION    . COLONOSCOPY  11/26/2011   Procedure: COLONOSCOPY;  Surgeon: Daneil Dolin, MD;  Location: [facility];  Service: Endoscopy;  Laterality: N/A;  12:30 PM        Home Medications    Prior to Admission medications   Medication Sig Start Date End Date Taking? Authorizing Provider  aspirin 81 MG tablet Take 81 mg by mouth daily.    [provider]  cetirizine (ZYRTEC) 10 MG tablet Take 10 mg by mouth daily. Pt takes either this or an OTC allergy pill    [provider]  ondansetron (ZOFRAN ODT) 4 MG disintegrating tablet Take 1 tablet (4 mg total) by mouth every 8 (eight) hours as needed for nausea or vomiting. 12/18/18   Francine Graven, DO  ondansetron (ZOFRAN) 4 MG tablet Take 1 tablet (4 mg total) by mouth every 6 (six) hours. 04/12/13   Triplett, Lynelle Smoke, PA-C  oxyCODONE-acetaminophen (PERCOCET/ROXICET) 5-325 MG tablet 1 or 2  tabs PO q8h prn pain 12/18/18   Francine Graven, DO  simvastatin (ZOCOR) 40 MG tablet Take 40 mg by mouth every evening.    [provider]  tamsulosin (FLOMAX) 0.4 MG CAPS capsule Take 1 capsule (0.4 mg total) by mouth at bedtime. 12/18/18   Francine Graven, DO    Family History Family History  Problem Relation Age of Onset  . Colon cancer Neg Hx     Social History Social History   Tobacco Use  . Smoking status: Never Smoker  . Smokeless tobacco: Never Used  Substance Use Topics  . Alcohol use: No  . Drug use: No     Allergies   Patient has no known allergies.   Review of Systems Review of Systems  Constitutional: Positive for activity change.  Cardiovascular: Negative for chest pain.  Gastrointestinal: Positive for abdominal pain, nausea and vomiting.  Genitourinary: Positive for testicular pain. Negative for difficulty urinating, dysuria, flank pain, frequency and hematuria.  Musculoskeletal: Negative for back pain.  Allergic/Immunologic: Negative for immunocompromised state.  All other systems reviewed and are negative.    Physical Exam Updated Vital Signs BP 114/80   Pulse (!) 54   Temp 97.7 F (36.5 C) (Oral)   Resp 18   Ht 5\' 5"  (1.651 m)  Wt 77.6 kg   SpO2 100%   BMI 28.46 kg/m   Physical Exam Vitals signs and nursing note reviewed.  Constitutional:      Appearance: He is well-developed.  HENT:     Head: Normocephalic and atraumatic.  Eyes:     Conjunctiva/sclera: Conjunctivae normal.     Pupils: Pupils are equal, round, and reactive to light.  Neck:     Musculoskeletal: Normal range of motion and neck supple.  Cardiovascular:     Rate and Rhythm: Normal rate and regular rhythm.  Pulmonary:     Effort: Pulmonary effort is normal.     Breath sounds: Normal breath sounds.  Abdominal:     General: Bowel sounds are normal. There is no distension.     Palpations: Abdomen is soft. There is no mass.     Tenderness: There is abdominal  tenderness in the right lower quadrant, periumbilical area, suprapubic area and left lower quadrant. There is no guarding or rebound.     Hernia: No hernia is present. There is no hernia in the umbilical area, left inguinal area, right femoral area, left femoral area or right inguinal area.  Genitourinary:    Scrotum/Testes: Normal.     Comments: Testes were freely mobile.  No signs of inguinal hernia. Musculoskeletal:        General: No deformity.  Skin:    General: Skin is warm.  Neurological:     Mental Status: He is alert and oriented to person, place, and time.      ED Treatments / Results  Labs (all labs ordered are listed, but only abnormal results are displayed) Labs Reviewed  BASIC METABOLIC PANEL - Abnormal; Notable for the following components:      Result Value   Glucose, Bld 117 (*)    All other components within normal limits  URINALYSIS, ROUTINE W REFLEX MICROSCOPIC - Abnormal; Notable for the following components:   Hgb urine dipstick LARGE (*)    Protein, ur 30 (*)    RBC / HPF >50 (*)    All other components within normal limits  CBC WITH DIFFERENTIAL/PLATELET    EKG None  Radiology Ct Abdomen Pelvis W Contrast  Result Date: 12/18/2018 CLINICAL DATA:  Abdominal pain EXAM: CT ABDOMEN AND PELVIS WITH CONTRAST TECHNIQUE: Multidetector CT imaging of the abdomen and pelvis was performed using the standard protocol following bolus administration of intravenous contrast. CONTRAST:  111mL OMNIPAQUE IOHEXOL 300 MG/ML  SOLN COMPARISON:  05/05/2013 CT, 04/27/2013 MRI FINDINGS: Lower chest: No acute abnormality. Hepatobiliary: Mild fatty infiltration of the liver is noted. There is a 4 cm lesion within the medial segment of the left lobe of the liver which demonstrates nodular peripheral enhancement consistent with a hepatic hemangioma. This is stable in appearance from a prior MRI and CT examination from 2015. Remainder of the liver is within normal limits. Gallbladder is  unremarkable. Pancreas: Unremarkable. No pancreatic ductal dilatation or surrounding inflammatory changes. Spleen: Normal in size without focal abnormality. Adrenals/Urinary Tract: Adrenal glands are within normal limits bilaterally. Kidneys demonstrate a normal enhancement pattern. No renal calculi are seen. Fullness of the right renal collecting system and right ureter is noted to the level of the ureterovesical junction. A 4 mm stone is noted at the right UVJ causing the obstructive change. The bladder is partially distended. Stomach/Bowel: No obstructive or inflammatory changes of the colon are seen. The appendix is well visualized and within normal limits. The small bowel and stomach show no focal abnormality.  Vascular/Lymphatic: No significant vascular findings are present. No enlarged abdominal or pelvic lymph nodes. Reproductive: Prostate is unremarkable. Other: No abdominal wall hernia or abnormality. No abdominopelvic ascites. Musculoskeletal: No acute or significant osseous findings. IMPRESSION: 4 mm right UVJ stone with mild obstructive changes. Stable hemangioma in the left lobe of the liver. Electronically Signed   By: Inez Catalina M.D.   On: 12/18/2018 07:39    Procedures Procedures (including critical care time)  Medications Ordered in ED Medications  sodium chloride 0.9 % bolus 1,000 mL (0 mLs Intravenous Stopped 12/18/18 0902)  iohexol (OMNIPAQUE) 300 MG/ML solution 100 mL (100 mLs Intravenous Contrast Given 12/18/18 0717)     Initial Impression / Assessment and Plan / ED Course  I have reviewed the triage vital signs and the nursing notes.  Pertinent labs & imaging results that were available during my care of the patient were reviewed by me and considered in my medical decision making (see chart for details).        58 year old comes in a chief complaint of abdominal pain.  He has history of high cholesterol and kidney stones.  It appears that the pain woke him up in the middle  the night, and the pain has been constant since then.  The pain is eased off in the ED. exam was nonfocal.  Differential diagnosis includes kidney stones, perforated viscus, diverticulitis, cystitis.  No torsion at the moment, but he could have had a torsion when the pain was intense.   CT scan of the abdomen has been ordered.  Final Clinical Impressions(s) / ED Diagnoses   Final diagnoses:  Ureteral colic  Right ureteral calculus    ED Discharge Orders         Ordered    ondansetron (ZOFRAN ODT) 4 MG disintegrating tablet  Every 8 hours PRN     12/18/18 0940    tamsulosin (FLOMAX) 0.4 MG CAPS capsule  Daily at bedtime     12/18/18 0940    oxyCODONE-acetaminophen (PERCOCET/ROXICET) 5-325 MG tablet     12/18/18 0940           Varney Biles, MD 12/19/18 0240

## 2019-06-17 ENCOUNTER — Ambulatory Visit: Payer: PRIVATE HEALTH INSURANCE | Attending: Internal Medicine

## 2019-06-17 DIAGNOSIS — Z23 Encounter for immunization: Secondary | ICD-10-CM | POA: Insufficient documentation

## 2019-06-17 NOTE — Progress Notes (Signed)
   Covid-19 Vaccination Clinic  Name:  Julian Wilson    MRN: [id number] DOB: 09-Mar-1961  06/17/2019  [name] was observed post Covid-19 immunization for 15 minutes without incident. He was provided with Vaccine Information Sheet and instruction to access the V-Safe system.   [name] was instructed to call 911 with any severe reactions post vaccine: Marland Kitchen Difficulty breathing  . Swelling of face and throat  . A fast heartbeat  . A bad rash all over body  . Dizziness and weakness   Immunizations Administered    Name Date Dose VIS Date Route   Moderna COVID-19 Vaccine 06/17/2019  8:51 AM 0.5 mL 03/16/2019 Intramuscular   Manufacturer: Moderna   Lot: [id number]   [id number]

## 2019-07-20 ENCOUNTER — Ambulatory Visit: Payer: PRIVATE HEALTH INSURANCE | Attending: Internal Medicine

## 2019-07-20 DIAGNOSIS — Z23 Encounter for immunization: Secondary | ICD-10-CM

## 2019-07-20 NOTE — Progress Notes (Signed)
   Covid-19 Vaccination Clinic  Name:  Julian Wilson    MRN: [id number] DOB: [birth year]  07/20/2019  Julian Wilson was observed post Covid-19 immunization for 15 minutes without incident. He was provided with Vaccine Information Sheet and instruction to access the V-Safe system.   Julian Wilson was instructed to call 911 with any severe reactions post vaccine: Marland Kitchen Difficulty breathing  . Swelling of face and throat  . A fast heartbeat  . A bad rash all over body  . Dizziness and weakness   Immunizations Administered    Name Date Dose VIS Date Route   Moderna COVID-19 Vaccine 07/20/2019  8:25 AM 0.5 mL 03/16/2019 Intramuscular   Manufacturer: Moderna   LotHQ:[id number]   [id number]

## 2020-05-29 ENCOUNTER — Other Ambulatory Visit: Payer: Self-pay | Admitting: Urology

## 2020-06-09 ENCOUNTER — Encounter (HOSPITAL_BASED_OUTPATIENT_CLINIC_OR_DEPARTMENT_OTHER): Payer: Self-pay | Admitting: Urology

## 2020-06-09 ENCOUNTER — Other Ambulatory Visit: Payer: Self-pay

## 2020-06-09 NOTE — Progress Notes (Signed)
Spoke w/ via phone for pre-op interview--- Pt and pt's wife Lab needs dos---- no              Lab results------ no COVID test ------ 06-12-2020 @ 0915 Arrive at ------- 0530 on 06-15-2020 NPO after MN NO Solid Food.  Clear liquids from MN until--- 0430 Medications to take morning of surgery ----- NONE Diabetic medication ----- n/a Patient Special Instructions ----- n/a Pre-Op special Istructions ----- n/a Patient verbalized understanding of instructions that were given at this phone interview. Patient denies shortness of breath, chest pain, fever, cough at this phone interview.

## 2020-06-12 ENCOUNTER — Other Ambulatory Visit (HOSPITAL_COMMUNITY)
Admission: RE | Admit: 2020-06-12 | Discharge: 2020-06-12 | Disposition: A | Discharge status: Home / Self Care | Payer: PRIVATE HEALTH INSURANCE | Source: Ambulatory Visit | Attending: Urology | Admitting: Urology

## 2020-06-12 DIAGNOSIS — Z01812 Encounter for preprocedural laboratory examination: Secondary | ICD-10-CM | POA: Diagnosis present

## 2020-06-12 DIAGNOSIS — Z20822 Contact with and (suspected) exposure to covid-19: Secondary | ICD-10-CM | POA: Insufficient documentation

## 2020-06-12 LAB — SARS CORONAVIRUS 2 (TAT 6-24 HRS): SARS Coronavirus 2: NEGATIVE

## 2020-06-13 NOTE — H&P (Signed)
H&P  Chief Complaint: Bladder stone  History of Present Illness: 79 yp male presents for cystolithalopaxy of a 16 mm symptomatic bladder stone.  Past Medical History:  Diagnosis Date  . Bladder calculus   . Full dentures   . History of kidney stones   . Hyperlipidemia   . Seasonal allergies     Past Surgical History:  Procedure Laterality Date  . CARPAL TUNNEL RELEASE Bilateral 2019;  2020  . CIRCUMCISION  2000 approx.  . COLONOSCOPY  11/26/2011   Procedure: COLONOSCOPY;  Surgeon: Daneil Dolin, MD;  Location: [facility];  Service: Endoscopy;  Laterality: N/A;  12:30 PM    Home Medications:  Allergies as of 06/13/2020   No Known Allergies     Medication List    Notice   Cannot display discharge medications because the patient has not yet been admitted.     Allergies: No Known Allergies  Family History  Problem Relation Age of Onset  . Colon cancer Neg Hx     Social History:  reports that he has never smoked. He has never used smokeless tobacco. He reports that he does not drink alcohol and does not use drugs.  ROS: A complete review of systems was performed.  All systems are negative except for pertinent findings as noted.  Physical Exam:  Vital signs in last 24 hours: Ht 5\' 5"  (1.651 m)   Wt 77.1 kg   BMI 28.29 kg/m  Constitutional:  Alert and oriented, No acute distress Cardiovascular: Regular rate  Respiratory: Normal respiratory effort GI: Abdomen is soft, nontender, nondistended, no abdominal masses. No CVAT.  Genitourinary: Normal male phallus, testes are descended bilaterally and non-tender and without masses, scrotum is normal in appearance without lesions or masses, perineum is normal on inspection. Lymphatic: No lymphadenopathy Neurologic: Grossly intact, no focal deficits Psychiatric: Normal mood and affect  Laboratory Data:  No results for input(s): WBC, HGB, HCT, PLT in the last 72 hours.  No results for input(s): NA, K, CL, GLUCOSE, BUN,  CALCIUM, CREATININE in the last 72 hours.  Invalid input(s): CO3   No results found for this or any previous visit (from the past 24 hour(s)). Recent Results (from the past 240 hour(s))  SARS CORONAVIRUS 2 (TAT 6-24 HRS) Nasopharyngeal Nasopharyngeal Swab     Status: None   Collection Time: 06/12/20  8:57 AM   Specimen: Nasopharyngeal Swab  Result Value Ref Range Status   SARS Coronavirus 2 NEGATIVE NEGATIVE Final    Comment: (NOTE) SARS-CoV-2 target nucleic acids are NOT DETECTED.  The SARS-CoV-2 RNA is generally detectable in upper and lower respiratory specimens during the acute phase of infection. Negative results do not preclude SARS-CoV-2 infection, do not rule out co-infections with other pathogens, and should not be used as the sole basis for treatment or other patient management decisions. Negative results must be combined with clinical observations, patient history, and epidemiological information. The expected result is Negative.  Fact Sheet for Patients: SugarRoll.be  Fact Sheet for Healthcare Providers: https://www.woods-mathews.com/  This test is not yet approved or cleared by the [place] FDA and  has been authorized for detection and/or diagnosis of SARS-CoV-2 by FDA under an Emergency Use Authorization (EUA). This EUA will remain  in effect (meaning this test can be used) for the duration of the COVID-19 declaration under Se ction 564(b)(1) of the Act, 21 U.S.C. section 360bbb-3(b)(1), unless the authorization is terminated or revoked sooner.  Performed at [facility], [place]  [address], [place], [place] [address]     Renal Function: No results for input(s): CREATININE in the last 168 hours. CrCl cannot be calculated (Patient's most recent lab result is older than the maximum 21 days allowed.).  Radiologic Imaging: No results found.  Impression/Assessment:  16 mm bladder stone  Plan:   Cystolithalopaxy

## 2020-06-14 ENCOUNTER — Encounter (HOSPITAL_BASED_OUTPATIENT_CLINIC_OR_DEPARTMENT_OTHER): Payer: Self-pay | Admitting: Urology

## 2020-06-14 NOTE — Anesthesia Preprocedure Evaluation (Addendum)
Anesthesia Evaluation  Patient identified by MRN, date of birth, ID band Patient awake    Reviewed: Allergy & Precautions, NPO status , Patient's Chart, lab work & pertinent test results  Airway Mallampati: II  TM Distance: >3 FB Neck ROM: Full    Dental  (+) Upper Dentures, Partial Lower,    Pulmonary neg pulmonary ROS,    Pulmonary exam normal breath sounds clear to auscultation       Cardiovascular negative cardio ROS Normal cardiovascular exam Rhythm:Regular Rate:Normal     Neuro/Psych negative neurological ROS  negative psych ROS   GI/Hepatic negative GI ROS, Neg liver ROS,   Endo/Other  Hyperlipidemia  Renal/GU Hx/o renal calculi   Bladder calculus    Musculoskeletal negative musculoskeletal ROS (+)   Abdominal   Peds  Hematology negative hematology ROS (+)   Anesthesia Other Findings   Reproductive/Obstetrics                          Anesthesia Physical Anesthesia Plan  ASA: II  Anesthesia Plan: General   Post-op Pain Management:    Induction: Intravenous  PONV Risk Score and Plan: 4 or greater and Treatment may vary due to age or medical condition, Ondansetron, Dexamethasone and Midazolam  Airway Management Planned: LMA  Additional Equipment:   Intra-op Plan:   Post-operative Plan: Extubation in OR  Informed Consent: I have reviewed the patients History and Physical, chart, labs and discussed the procedure including the risks, benefits and alternatives for the proposed anesthesia with the patient or authorized representative who has indicated his/her understanding and acceptance.     Dental advisory given  Plan Discussed with: CRNA and Anesthesiologist  Anesthesia Plan Comments:        Anesthesia Quick Evaluation

## 2020-06-15 ENCOUNTER — Encounter (HOSPITAL_BASED_OUTPATIENT_CLINIC_OR_DEPARTMENT_OTHER)
Admission: RE | Disposition: A | Discharge status: Home / Self Care | Payer: Self-pay | Source: Home / Self Care | Attending: Urology

## 2020-06-15 ENCOUNTER — Encounter (HOSPITAL_BASED_OUTPATIENT_CLINIC_OR_DEPARTMENT_OTHER): Payer: Self-pay | Admitting: Urology

## 2020-06-15 ENCOUNTER — Ambulatory Visit (HOSPITAL_BASED_OUTPATIENT_CLINIC_OR_DEPARTMENT_OTHER)
Admission: RE | Admit: 2020-06-15 | Discharge: 2020-06-15 | Disposition: A | Discharge status: Home / Self Care | Payer: PRIVATE HEALTH INSURANCE | Attending: Urology | Admitting: Urology

## 2020-06-15 ENCOUNTER — Ambulatory Visit (HOSPITAL_BASED_OUTPATIENT_CLINIC_OR_DEPARTMENT_OTHER): Payer: PRIVATE HEALTH INSURANCE | Admitting: Anesthesiology

## 2020-06-15 DIAGNOSIS — N210 Calculus in bladder: Secondary | ICD-10-CM | POA: Insufficient documentation

## 2020-06-15 HISTORY — DX: Hyperlipidemia, unspecified: E78.5

## 2020-06-15 HISTORY — DX: Calculus in bladder: N21.0

## 2020-06-15 HISTORY — PX: CYSTOSCOPY WITH LITHOLAPAXY: SHX1425

## 2020-06-15 HISTORY — DX: Complete loss of teeth, unspecified cause, unspecified class: K08.109

## 2020-06-15 HISTORY — PX: HOLMIUM LASER APPLICATION: SHX5852

## 2020-06-15 HISTORY — DX: Personal history of urinary calculi: Z87.442

## 2020-06-15 SURGERY — CYSTOSCOPY, WITH BLADDER CALCULUS LITHOLAPAXY
Anesthesia: General | Site: Bladder

## 2020-06-15 MED ORDER — OXYCODONE HCL 5 MG/5ML PO SOLN: 5.0000 mg | Freq: Once | ORAL | Status: DC | PRN

## 2020-06-15 MED ORDER — CEFAZOLIN SODIUM-DEXTROSE 2-4 GM/100ML-% IV SOLN
2.0000 g | INTRAVENOUS | Status: AC
  Administered 2020-06-15: 2 g via INTRAVENOUS

## 2020-06-15 MED ORDER — AMISULPRIDE (ANTIEMETIC) 5 MG/2ML IV SOLN: 10.0000 mg | Freq: Once | INTRAVENOUS | Status: DC | PRN

## 2020-06-15 MED ORDER — ONDANSETRON HCL 4 MG/2ML IJ SOLN
INTRAMUSCULAR | Status: AC
  Filled 2020-06-15: qty 2

## 2020-06-15 MED ORDER — CEFAZOLIN SODIUM-DEXTROSE 2-4 GM/100ML-% IV SOLN
INTRAVENOUS | Status: AC
  Filled 2020-06-15: qty 100

## 2020-06-15 MED ORDER — STERILE WATER FOR IRRIGATION IR SOLN
Status: DC | PRN
  Administered 2020-06-15: 3000 mL via INTRAVESICAL

## 2020-06-15 MED ORDER — EPHEDRINE SULFATE-NACL 50-0.9 MG/10ML-% IV SOSY
PREFILLED_SYRINGE | INTRAVENOUS | Status: DC | PRN
  Administered 2020-06-15: 10 mg via INTRAVENOUS

## 2020-06-15 MED ORDER — LIDOCAINE HCL (PF) 2 % IJ SOLN
INTRAMUSCULAR | Status: AC
  Filled 2020-06-15: qty 5

## 2020-06-15 MED ORDER — PROPOFOL 10 MG/ML IV BOLUS
INTRAVENOUS | Status: DC | PRN
  Administered 2020-06-15: 120 mg via INTRAVENOUS

## 2020-06-15 MED ORDER — EPHEDRINE 5 MG/ML INJ
INTRAVENOUS | Status: AC
  Filled 2020-06-15: qty 10

## 2020-06-15 MED ORDER — OXYCODONE HCL 5 MG PO TABS: 5.0000 mg | ORAL_TABLET | Freq: Once | ORAL | Status: DC | PRN

## 2020-06-15 MED ORDER — LACTATED RINGERS IV SOLN: INTRAVENOUS | Status: DC

## 2020-06-15 MED ORDER — FENTANYL CITRATE (PF) 100 MCG/2ML IJ SOLN: 25.0000 ug | INTRAMUSCULAR | Status: DC | PRN

## 2020-06-15 MED ORDER — DEXAMETHASONE SODIUM PHOSPHATE 10 MG/ML IJ SOLN
INTRAMUSCULAR | Status: DC | PRN
  Administered 2020-06-15: 5 mg via INTRAVENOUS

## 2020-06-15 MED ORDER — PROPOFOL 10 MG/ML IV BOLUS
INTRAVENOUS | Status: AC
  Filled 2020-06-15: qty 20

## 2020-06-15 MED ORDER — CEPHALEXIN 500 MG PO CAPS: 500.0000 mg | ORAL_CAPSULE | Freq: Two times a day (BID) | ORAL | 0 refills | Status: AC

## 2020-06-15 MED ORDER — FENTANYL CITRATE (PF) 100 MCG/2ML IJ SOLN
INTRAMUSCULAR | Status: AC
  Filled 2020-06-15: qty 2

## 2020-06-15 MED ORDER — DEXAMETHASONE SODIUM PHOSPHATE 10 MG/ML IJ SOLN
INTRAMUSCULAR | Status: AC
  Filled 2020-06-15: qty 1

## 2020-06-15 MED ORDER — TRAMADOL HCL 50 MG PO TABS: 50.0000 mg | ORAL_TABLET | Freq: Four times a day (QID) | ORAL | 0 refills | Status: DC | PRN

## 2020-06-15 MED ORDER — ONDANSETRON HCL 4 MG/2ML IJ SOLN: 4.0000 mg | Freq: Once | INTRAMUSCULAR | Status: DC | PRN

## 2020-06-15 MED ORDER — FENTANYL CITRATE (PF) 100 MCG/2ML IJ SOLN
INTRAMUSCULAR | Status: DC | PRN
  Administered 2020-06-15: 50 ug via INTRAVENOUS

## 2020-06-15 MED ORDER — LIDOCAINE 2% (20 MG/ML) 5 ML SYRINGE
INTRAMUSCULAR | Status: DC | PRN
  Administered 2020-06-15: 50 mg via INTRAVENOUS

## 2020-06-15 MED ORDER — MIDAZOLAM HCL 2 MG/2ML IJ SOLN
INTRAMUSCULAR | Status: AC
  Filled 2020-06-15: qty 2

## 2020-06-15 MED ORDER — MIDAZOLAM HCL 2 MG/2ML IJ SOLN
INTRAMUSCULAR | Status: DC | PRN
  Administered 2020-06-15: 2 mg via INTRAVENOUS

## 2020-06-15 MED ORDER — ONDANSETRON HCL 4 MG/2ML IJ SOLN
INTRAMUSCULAR | Status: DC | PRN
  Administered 2020-06-15: 4 mg via INTRAVENOUS

## 2020-06-15 SURGICAL SUPPLY — 19 items
BAG DRAIN URO-CYSTO SKYTR STRL (DRAIN) ×2 IMPLANT
BAG DRN RND TRDRP ANRFLXCHMBR (UROLOGICAL SUPPLIES) ×1
BAG DRN UROCATH (DRAIN) ×1
BAG URINE DRAIN 2000ML AR STRL (UROLOGICAL SUPPLIES) ×2 IMPLANT
BAG URINE LEG 500ML (DRAIN) ×2 IMPLANT
CATH FOLEY 2WAY SLVR  5CC 18FR (CATHETERS) ×2
CATH FOLEY 2WAY SLVR 5CC 18FR (CATHETERS) ×1 IMPLANT
CLOTH BEACON ORANGE TIMEOUT ST (SAFETY) ×4 IMPLANT
FIBER LASER FLEXIVA 1000 (UROLOGICAL SUPPLIES) IMPLANT
FIBER LASER FLEXIVA 365 (UROLOGICAL SUPPLIES) ×2 IMPLANT
FIBER LASER FLEXIVA 550 (UROLOGICAL SUPPLIES) IMPLANT
FIBER LASER TRAC TIP (UROLOGICAL SUPPLIES) IMPLANT
GLOVE SURG ENC MOIS LTX SZ8 (GLOVE) ×2 IMPLANT
GOWN STRL REUS W/TWL XL LVL3 (GOWN DISPOSABLE) ×2 IMPLANT
KIT TURNOVER CYSTO (KITS) ×2 IMPLANT
MANIFOLD NEPTUNE II (INSTRUMENTS) ×2 IMPLANT
PACK CYSTO (CUSTOM PROCEDURE TRAY) ×2 IMPLANT
TUBE CONNECTING 12X1/4 (SUCTIONS) ×2 IMPLANT
WATER STERILE IRR 500ML POUR (IV SOLUTION) IMPLANT

## 2020-06-15 NOTE — Anesthesia Procedure Notes (Signed)
Procedure Name: LMA Insertion Date/Time: 06/15/2020 7:35 AM Performed by: Suan Halter, CRNA Pre-anesthesia Checklist: Patient identified, Emergency Drugs available, Suction available and Patient being monitored Patient Re-evaluated:Patient Re-evaluated prior to induction Oxygen Delivery Method: Circle system utilized Preoxygenation: Pre-oxygenation with 100% oxygen Induction Type: IV induction Ventilation: Mask ventilation without difficulty LMA: LMA inserted LMA Size: 4.0 Number of attempts: 1 Airway Equipment and Method: Bite block Placement Confirmation: positive ETCO2 Tube secured with: Tape Dental Injury: Teeth and Oropharynx as per pre-operative assessment

## 2020-06-15 NOTE — Discharge Instructions (Signed)
1. You may see some blood in the urine and may have some burning with urination for 48-72 hours. You also may notice that you have to urinate more frequently or urgently after your procedure which is normal.  2. You should call should you develop an inability urinate, fever > 101, persistent nausea and vomiting that prevents you from eating or drinking to stay hydrated.  3. If you have a catheter, you will be taught how to take care of the catheter by the nursing staff prior to discharge from the hospital.  You may periodically feel a strong urge to void with the catheter in place.  This is a bladder spasm and most often can occur when having a bowel movement or moving around. It is typically self-limited and usually will stop after a few minutes.  You may use some Vaseline or Neosporin around the tip of the catheter to reduce friction at the tip of the penis. You may also see some blood in the urine.  A very small amount of blood can make the urine look quite red.  As long as the catheter is draining well, there usually is not a problem.  However, if the catheter is not draining well and is bloody, you should call the office [phone]) to notify us. Unless there are significant issues with the catheter, it is okay to remove it as directed by the nurses on Friday morning.   CYSTOSCOPY HOME CARE INSTRUCTIONS  Activity: Rest for the remainder of the day.  Do not drive or operate equipment today.  You may resume normal activities in one to two days as instructed by your physician.   Meals: Drink plenty of liquids and eat light foods such as gelatin or soup this evening.  You may return to a normal meal plan tomorrow.  Return to Work: You may return to work in one to two days or as instructed by your physician.  Special Instructions / Symptoms: Call your physician if any of these symptoms occur:   -persistent or heavy bleeding  -bleeding which continues after first few urination  -large blood  clots that are difficult to pass  -urine stream diminishes or stops completely  -fever equal to or higher than 101 degrees Farenheit.  -cloudy urine with a strong, foul odor  -severe pain  Females should always wipe from front to back after elimination.  You may feel some burning pain when you urinate.  This should disappear with time.  Applying moist heat to the lower abdomen or a hot tub bath may help relieve the pain. \  Follow-Up / Date of Return Visit to Your Physician:  As instructed Call for an appointment to arrange follow-up.     Post Anesthesia Home Care Instructions  Activity: Get plenty of rest for the remainder of the day. A responsible individual must stay with you for 24 hours following the procedure.  For the next 24 hours, DO NOT: -Drive a car -Paediatric nurse -Drink alcoholic beverages -Take any medication unless instructed by your physician -Make any legal decisions or sign important papers.  Meals: Start with liquid foods such as gelatin or soup. Progress to regular foods as tolerated. Avoid greasy, spicy, heavy foods. If nausea and/or vomiting occur, drink only clear liquids until the nausea and/or vomiting subsides. Call your physician if vomiting continues.  Special Instructions/Symptoms: Your throat may feel dry or sore from the anesthesia or the breathing tube placed in your throat during surgery. If this causes discomfort, gargle  with warm salt water. The discomfort should disappear within 24 hours.  If you had a scopolamine patch placed behind your ear for the management of post- operative nausea and/or vomiting:  1. The medication in the patch is effective for 72 hours, after which it should be removed.  Wrap patch in a tissue and discard in the trash. Wash hands thoroughly with soap and water. 2. You may remove the patch earlier than 72 hours if you experience unpleasant side effects which may include dry mouth, dizziness or visual disturbances. 3.  Avoid touching the patch. Wash your hands with soap and water after contact with the patch.

## 2020-06-15 NOTE — Interval H&P Note (Signed)
History and Physical Interval Note:  06/15/2020 7:22 AM  Julian Wilson  has presented today for surgery, with the diagnosis of BLADDER STONE.  The various methods of treatment have been discussed with the patient and family. After consideration of risks, benefits and other options for treatment, the patient has consented to  Procedure(s) with comments: CYSTOSCOPY WITH LITHOLAPAXY (N/A) - 28 MINS HOLMIUM LASER APPLICATION (N/A) as a surgical intervention.  The patient's history has been reviewed, patient examined, no change in status, stable for surgery.  I have reviewed the patient's chart and labs.  Questions were answered to the patient's satisfaction.     Lillette Boxer Terrina Docter

## 2020-06-15 NOTE — Op Note (Signed)
Preoperative diagnosis: Bladder calculus, 18 mm  Postoperative diagnosis: Same  Principal procedure: Cystolitholapaxy 18 mm bladder calculus  Surgeon: Tika Hannis  Anesthesia: General with LMA  Complications: None  Estimated blood loss: Less than 5 mL  Specimen: Stone fragments  Indications: 60 year old male with significantly symptomatic 18 mm bladder calculus. He presents at this time for cystolitholapaxy. I have discussed the procedure, risks and complications with him. These include but are not limited to infection, bleeding, bladder trauma, anesthetic complications, among others. He understands and desires to proceed.  Findings: Urethra was normal, prostatic urethra obstructive with trilobar hypertrophy. Moderate size median lobe. Bladder urothelium was normal except for the expected erythema underlying the posteriorly based stone. Ureteral orifices were normal in location and configuration. Mild to moderate trabeculations present.  Description of procedure: The patient was properly identified in the holding area and received preoperative IV antibiotics. Was taken to the operating room where general anesthetic was administered with the LMA. Is placed in the dorsolithotomy position. Genitalia and perineum were prepped, draped, proper timeout was performed.  3 French panendoscope was advanced into the bladder under direct vision. Inspection of the bladder was performed, the stone was identified posteriorly. Using the 365 m fiber, laser energy was applied to the stone at a rate of 30 Hz and energy level of 1.0 J. Stone was fragmented into multiple smaller fragments. Fragments were then directly drained from the bladder by applying the scope to the fragments, and also irrigated with the [facility] syringe. All significant fragments and dust/powder was removed from the bladder with adequate inspection of the entire bladder seen. No significant injury to the bladder wall was found. There was a mild  amount of bleeding from the large median lobe. After removal of all fragments and the scope, a an 32 [place] Foley catheter was placed, balloon filled with 10 cc of water, hooked to dependent drainage. At this point, the procedure was terminated. The patient was awakened and taken to the PACU in stable condition, having tolerated procedure well.

## 2020-06-15 NOTE — Transfer of Care (Signed)
Immediate Anesthesia Transfer of Care Note  Patient: Julian Wilson  Procedure(s) Performed: Procedure(s) (LRB): CYSTOSCOPY WITH LITHOLAPAXY (N/A) HOLMIUM LASER APPLICATION (N/A)  Patient Location: PACU  Anesthesia Type: General  Level of Consciousness: awake, oriented, sedated and patient cooperative  Airway & Oxygen Therapy: Patient Spontanous Breathing and Patient connected to face mask oxygen  Post-op Assessment: Report given to PACU RN and Post -op Vital signs reviewed and stable  Post vital signs: Reviewed and stable  Complications: No apparent anesthesia complications Last Vitals:  Vitals Value Taken Time  BP    Temp    Pulse 72 06/15/20 0806  Resp 18 06/15/20 0806  SpO2 98 % 06/15/20 0806  Vitals shown include unvalidated device data.  Last Pain:  Vitals:   06/15/20 0549  TempSrc: Oral  PainSc: 5       Patients Stated Pain Goal: 3 (37/35/78 9784)  Complications: No complications documented.

## 2020-06-15 NOTE — Anesthesia Postprocedure Evaluation (Signed)
Anesthesia Post Note  Patient: Julian Wilson  Procedure(s) Performed: CYSTOSCOPY WITH LITHOLAPAXY (N/A Bladder) HOLMIUM LASER APPLICATION (N/A Bladder)     Patient location during evaluation: PACU Anesthesia Type: General Level of consciousness: awake and alert and oriented Pain management: pain level controlled Vital Signs Assessment: post-procedure vital signs reviewed and stable Respiratory status: spontaneous breathing, nonlabored ventilation and respiratory function stable Cardiovascular status: blood pressure returned to baseline and stable Postop Assessment: no apparent nausea or vomiting Anesthetic complications: no   No complications documented.  Last Vitals:  Vitals:   06/15/20 0845 06/15/20 0900  BP: 122/70 130/79  Pulse: (!) 47 (!) 54  Resp: 14 17  Temp:    SpO2: 99% 100%    Last Pain:  Vitals:   06/15/20 0900  TempSrc:   PainSc: 0-No pain                 Hanley Woerner A.

## 2020-06-16 ENCOUNTER — Encounter (HOSPITAL_BASED_OUTPATIENT_CLINIC_OR_DEPARTMENT_OTHER): Payer: Self-pay | Admitting: Urology

## 2021-01-29 ENCOUNTER — Other Ambulatory Visit (HOSPITAL_COMMUNITY): Payer: Self-pay

## 2021-01-29 MED ORDER — INFLUENZA VAC SPLIT QUAD 0.5 ML IM SUSY
PREFILLED_SYRINGE | INTRAMUSCULAR | 0 refills | Status: DC
  Filled 2021-01-29: qty 0.5, 1d supply, fill #0

## 2021-02-15 IMAGING — CT CT ABD-PELV W/ CM
2 of 5 series · 15 of 46 positions shown, 17 images · IV contrast (omnipaque)
Comparison: 05/05/2013 CT, 04/27/2013 MRI

CLINICAL DATA: Abdominal pain

EXAM:
CT ABDOMEN AND PELVIS WITH CONTRAST
TECHNIQUE: Multidetector CT imaging of the abdomen and pelvis was performed
using the standard protocol following bolus administration of
intravenous contrast.
CONTRAST:  100mL OMNIPAQUE IOHEXOL 300 MG/ML  SOLN

[Series 2: axial st · axial · 0.65mm/px · z∈[+975,+1365]mm · 12 of 90 slices shown, 14 images]
[im 6/90  soft-tissue]
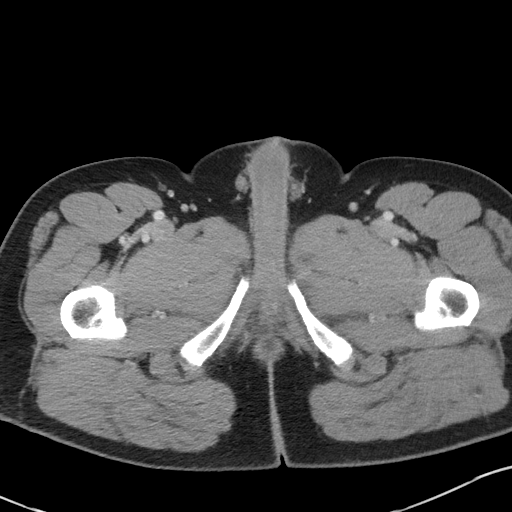
[im 6/90  bone]
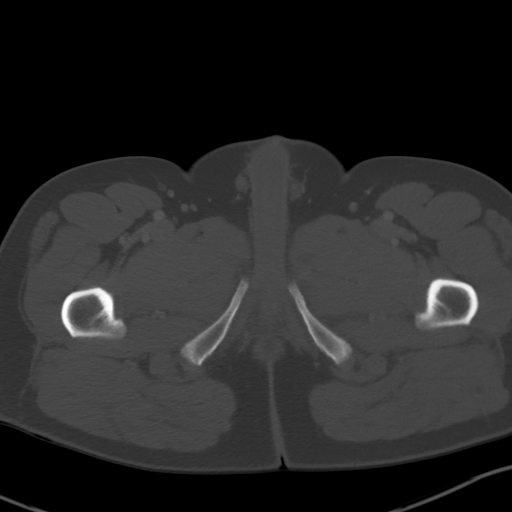
[im 16/90  soft-tissue]
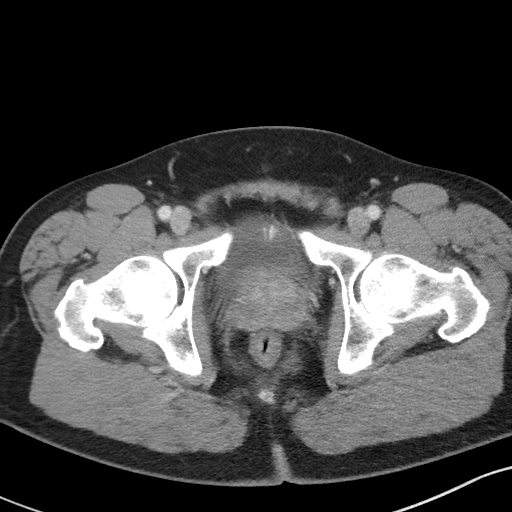
[im 21/90  soft-tissue]
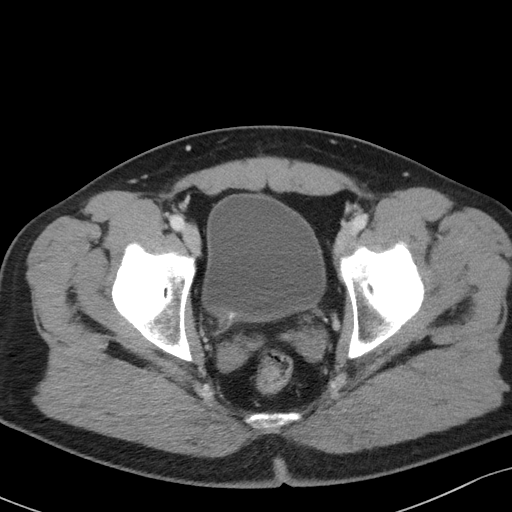
[im 27/90  soft-tissue]
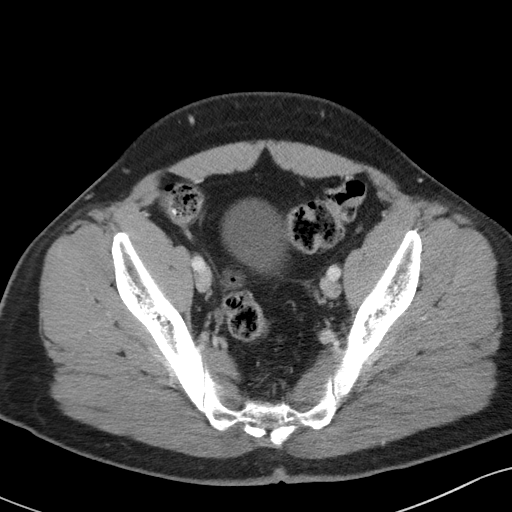
[im 37/90  soft-tissue]
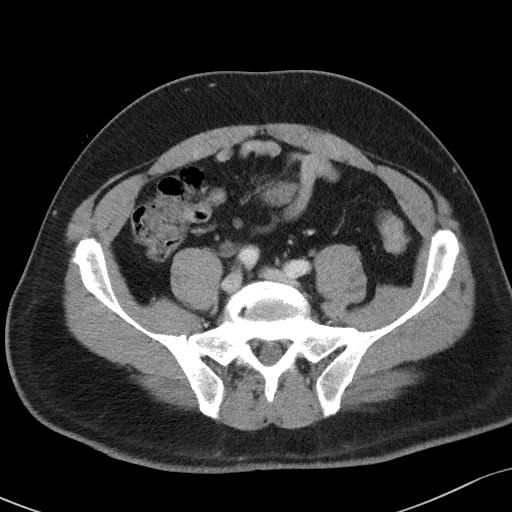
[im 42/90  soft-tissue]
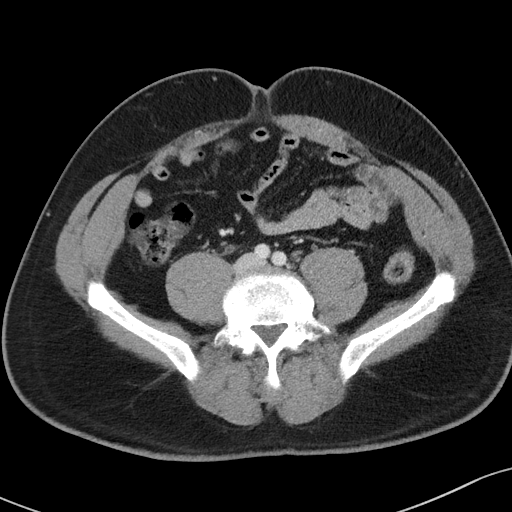
[im 48/90  soft-tissue]
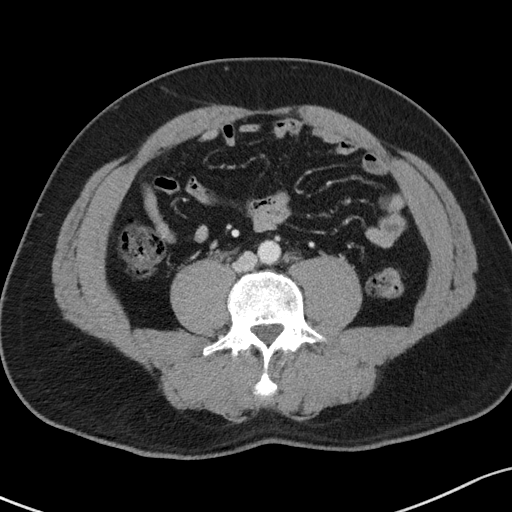
[im 58/90  soft-tissue]
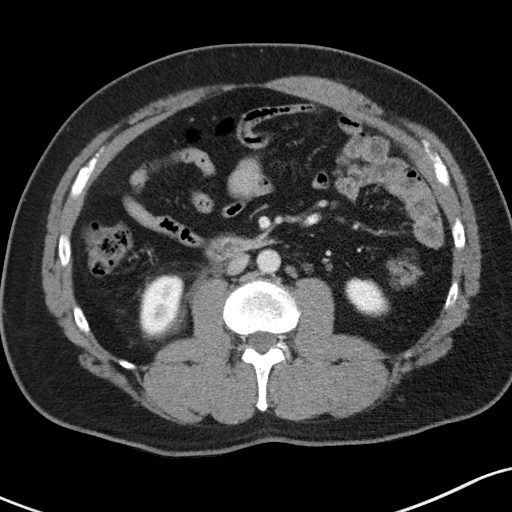
[im 63/90  soft-tissue]
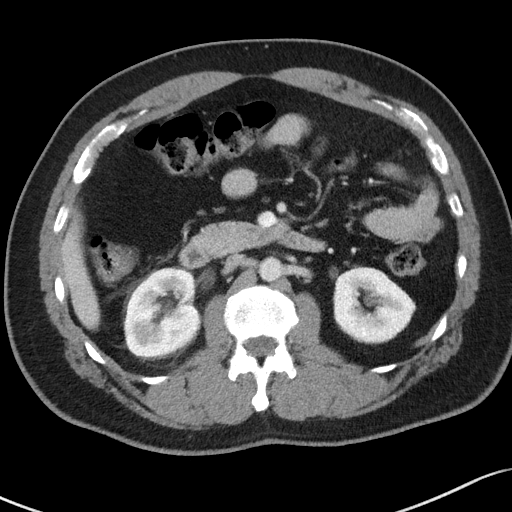
[im 63/90  bone]
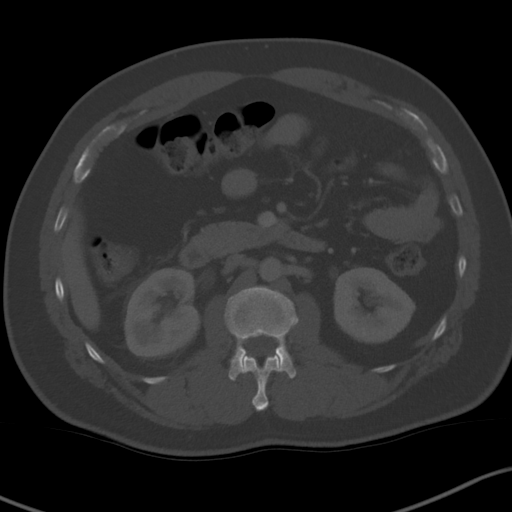
[im 69/90  soft-tissue]
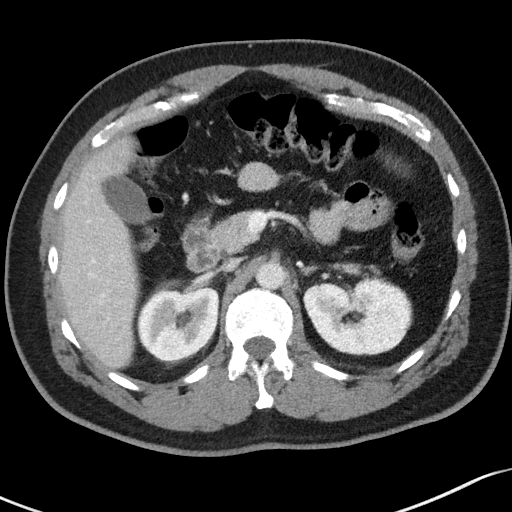
[im 79/90  soft-tissue]
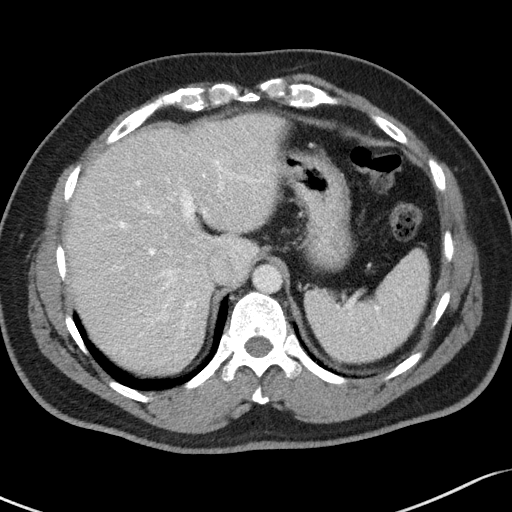
[im 84/90  soft-tissue]
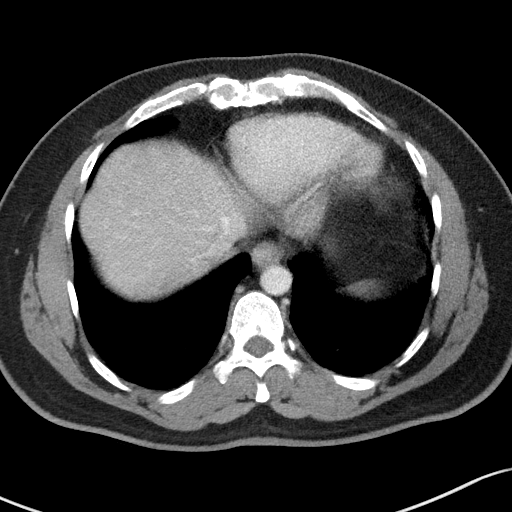

[Series 5: coronal st · coronal · 0.63mm/px · 3 of 95 slices shown]
[im 32/95  soft-tissue]
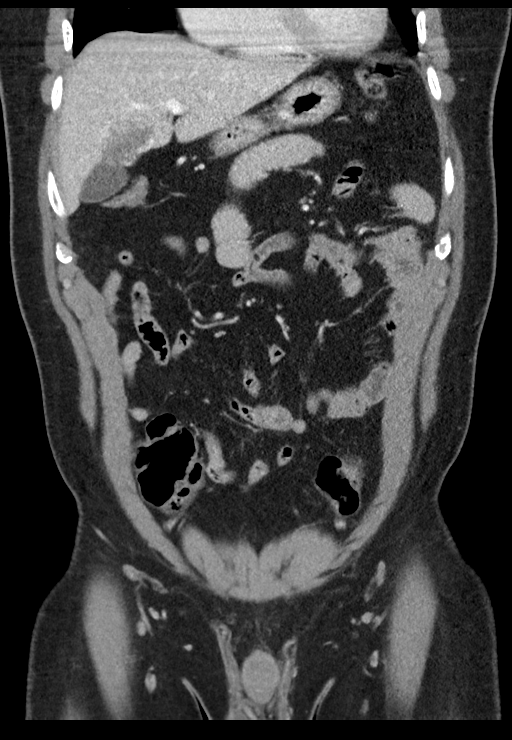
[im 42/95  soft-tissue]
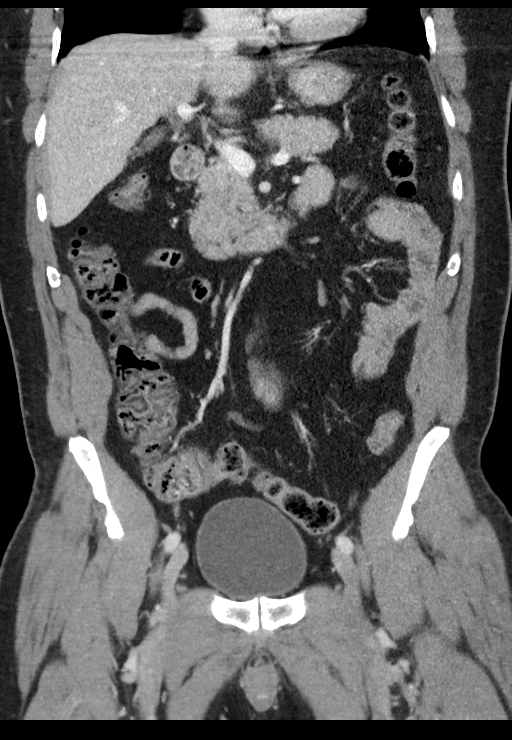
[im 53/95  soft-tissue]
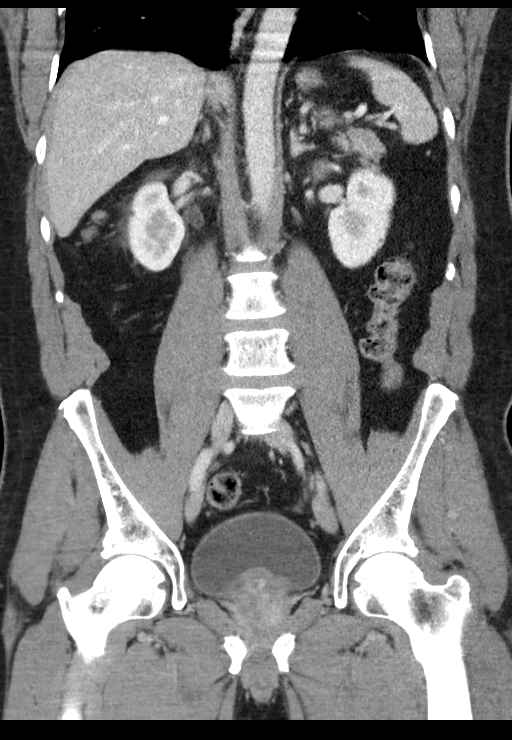

[15 of 46 positions shown; findings below may reference images not displayed]

FINDINGS: Lower chest: No acute abnormality.

Hepatobiliary: Mild fatty infiltration of the liver is noted. There
is a 4 cm lesion within the medial segment of the left lobe of the
liver which demonstrates nodular peripheral enhancement consistent
with a hepatic hemangioma. This is stable in appearance from a prior
MRI and CT examination from 1652. Remainder of the liver is within
normal limits. Gallbladder is unremarkable.

Pancreas: Unremarkable. No pancreatic ductal dilatation or
surrounding inflammatory changes.

Spleen: Normal in size without focal abnormality.

Adrenals/Urinary Tract: Adrenal glands are within normal limits
bilaterally. Kidneys demonstrate a normal enhancement pattern. No
renal calculi are seen. Fullness of the right renal collecting
system and right ureter is noted to the level of the ureterovesical
junction. A 4 mm stone is noted at the right UVJ causing the
obstructive change. The bladder is partially distended.

Stomach/Bowel: No obstructive or inflammatory changes of the colon
are seen. The appendix is well visualized and within normal limits.
The small bowel and stomach show no focal abnormality.

Vascular/Lymphatic: No significant vascular findings are present. No
enlarged abdominal or pelvic lymph nodes.

Reproductive: Prostate is unremarkable.

Other: No abdominal wall hernia or abnormality. No abdominopelvic
ascites.

Musculoskeletal: No acute or significant osseous findings.
IMPRESSION: 4 mm right UVJ stone with mild obstructive changes.

Stable hemangioma in the left lobe of the liver.

## 2021-10-10 ENCOUNTER — Other Ambulatory Visit: Payer: Self-pay | Admitting: Urology

## 2021-10-10 DIAGNOSIS — R972 Elevated prostate specific antigen [PSA]: Secondary | ICD-10-CM

## 2021-10-25 ENCOUNTER — Encounter (INDEPENDENT_AMBULATORY_CARE_PROVIDER_SITE_OTHER): Payer: Self-pay | Admitting: *Deleted

## 2021-11-01 ENCOUNTER — Encounter: Payer: Self-pay | Admitting: *Deleted

## 2021-11-01 NOTE — Patient Instructions (Signed)
  Procedure: Colonoscopy   Estimated body mass index is 26.63 kg/m as calculated from the following:   Height as of this encounter: '5\' 5"'$  (1.651 m).   Weight as of this encounter: 160 lb (72.6 kg).   Have you had a colonoscopy before?  Yes 11/26/11, Dr. Gala Romney  Do you have family history of colon cancer  no  Do you have a family history of polyps? no  Previous colonoscopy with polyps removed? no  Do you have a history colorectal cancer?   no  Are you diabetic?  no  Do you have a prosthetic or mechanical heart valve? no  Do you have a pacemaker/defibrillator?   no  Have you had endocarditis/atrial fibrillation?  no  Do you use supplemental oxygen/CPAP?  no  Have you had joint replacement within the last 12 months?  no  Do you tend to be constipated or have to use laxatives?  no   Do you have history of alcohol use? If yes, how much and how often.  no  Do you have history or are you using drugs? If yes, what do are you  using?  no  Have you ever had a stroke/heart attack?  no  Have you ever had a heart or other vascular stent placed,?no  Do you take weight loss medication? no   Do you take any blood-thinning medications such as: (Plavix, aspirin, Coumadin, Aggrenox, Brilinta, Xarelto, Eliquis, Pradaxa, Savaysa or Effient) ASA '81MG'$   If yes we need the name, milligram, dosage and who is prescribing doctor:               Current Outpatient Medications  Medication Sig Dispense Refill   acetaminophen (TYLENOL) 500 MG tablet Take 500 mg by mouth every 6 (six) hours as needed.     aspirin 81 MG tablet Take 81 mg by mouth daily.     cetirizine (ZYRTEC) 10 MG tablet Take 10 mg by mouth at bedtime. Pt takes either this or an OTC allergy pill     simvastatin (ZOCOR) 40 MG tablet Take 40 mg by mouth every evening.     No current facility-administered medications for this visit.    No Known Allergies

## 2021-11-02 ENCOUNTER — Encounter: Payer: Self-pay | Admitting: *Deleted

## 2021-11-02 MED ORDER — PEG 3350-KCL-NA BICARB-NACL 420 G PO SOLR: 4000.0000 mL | Freq: Once | ORAL | 0 refills | Status: AC

## 2021-11-02 NOTE — Progress Notes (Signed)
Called [facility] and spoke with [employer]. No PA required in outpatient setting.

## 2021-11-02 NOTE — Progress Notes (Signed)
Spoke with pt. Scheduled for 8/17 at 1pm. Aware will send instructions. Rx sent to pharmacy

## 2021-11-29 ENCOUNTER — Other Ambulatory Visit: Payer: Self-pay

## 2021-11-29 ENCOUNTER — Ambulatory Visit (HOSPITAL_COMMUNITY): Payer: PRIVATE HEALTH INSURANCE | Admitting: Anesthesiology

## 2021-11-29 ENCOUNTER — Ambulatory Visit (HOSPITAL_COMMUNITY)
Admission: RE | Admit: 2021-11-29 | Discharge: 2021-11-29 | Disposition: A | Discharge status: Home / Self Care | Payer: PRIVATE HEALTH INSURANCE | Attending: Internal Medicine | Admitting: Internal Medicine

## 2021-11-29 ENCOUNTER — Encounter (HOSPITAL_COMMUNITY): Payer: Self-pay | Admitting: Internal Medicine

## 2021-11-29 ENCOUNTER — Encounter (HOSPITAL_COMMUNITY)
Admission: RE | Disposition: A | Discharge status: Home / Self Care | Payer: Self-pay | Source: Home / Self Care | Attending: Internal Medicine

## 2021-11-29 ENCOUNTER — Ambulatory Visit (HOSPITAL_BASED_OUTPATIENT_CLINIC_OR_DEPARTMENT_OTHER): Payer: PRIVATE HEALTH INSURANCE | Admitting: Anesthesiology

## 2021-11-29 DIAGNOSIS — Z1211 Encounter for screening for malignant neoplasm of colon: Secondary | ICD-10-CM | POA: Diagnosis not present

## 2021-11-29 DIAGNOSIS — Z1212 Encounter for screening for malignant neoplasm of rectum: Secondary | ICD-10-CM | POA: Diagnosis not present

## 2021-11-29 HISTORY — PX: COLONOSCOPY WITH PROPOFOL: SHX5780

## 2021-11-29 SURGERY — COLONOSCOPY WITH PROPOFOL
Anesthesia: General

## 2021-11-29 MED ORDER — LACTATED RINGERS IV SOLN: INTRAVENOUS | Status: DC

## 2021-11-29 MED ORDER — PROPOFOL 10 MG/ML IV BOLUS
INTRAVENOUS | Status: DC | PRN
  Administered 2021-11-29: 10 mg via INTRAVENOUS
  Administered 2021-11-29: 20 mg via INTRAVENOUS
  Administered 2021-11-29: 150 mg via INTRAVENOUS

## 2021-11-29 NOTE — H&P (Signed)
$'@LOGO'l$ @   Primary Care Physician:  Iona Beard, MD Primary Gastroenterologist:  Dr. Gala Romney  Pre-Procedure History & Physical: HPI:  Julian Wilson is a 61 y.o. male is here for a screening colonoscopy.  Normal examination 10 years ago.  No bowel symptoms.  No family history of colon cancer.  Past Medical History:  Diagnosis Date   Bladder calculus    Full dentures    History of kidney stones    Hyperlipidemia    Seasonal allergies     Past Surgical History:  Procedure Laterality Date   CARPAL TUNNEL RELEASE Bilateral 2019;  2020   CIRCUMCISION  2000 approx.   COLONOSCOPY  11/26/2011   Procedure: COLONOSCOPY;  Surgeon: Daneil Dolin, MD;  Location: [facility];  Service: Endoscopy;  Laterality: N/A;  12:30 PM   CYSTOSCOPY WITH LITHOLAPAXY N/A 06/15/2020   Procedure: CYSTOSCOPY WITH LITHOLAPAXY;  Surgeon: Franchot Gallo, MD;  Location: [facility];  Service: Urology;  Laterality: N/A;  45 MINS   HOLMIUM LASER APPLICATION N/A 11/15/9935   Procedure: HOLMIUM LASER APPLICATION;  Surgeon: Franchot Gallo, MD;  Location: [facility];  Service: Urology;  Laterality: N/A;    Prior to Admission medications   Medication Sig Start Date End Date Taking? Authorizing Provider  aspirin 81 MG tablet Take 81 mg by mouth daily.   Yes [provider]  cetirizine (ZYRTEC) 10 MG tablet Take 10 mg by mouth at bedtime. Pt takes either this or an OTC allergy pill   Yes [provider]  simvastatin (ZOCOR) 40 MG tablet Take 40 mg by mouth every evening.   Yes [provider]  acetaminophen (TYLENOL) 500 MG tablet Take 1,000 mg by mouth every 6 (six) hours as needed for mild pain.    [provider]  naproxen sodium (ALEVE) 220 MG tablet Take 220 mg by mouth daily as needed (back pain).    [provider]    Allergies as of 11/02/2021   (No Known Allergies)    Family History  Problem Relation Age of Onset   Colon  cancer Neg Hx     Social History   Socioeconomic History   Marital status: Married    Spouse name: Not on file   Number of children: Not on file   Years of education: Not on file   Highest education level: Not on file  Occupational History   Not on file  Tobacco Use   Smoking status: Never   Smokeless tobacco: Never  Vaping Use   Vaping Use: Never used  Substance and Sexual Activity   Alcohol use: No   Drug use: No   Sexual activity: Not on file  Other Topics Concern   Not on file  Social History Narrative   Not on file   Social Determinants of Health   Financial Resource Strain: Not on file  Food Insecurity: Not on file  Transportation Needs: Not on file  Physical Activity: Not on file  Stress: Not on file  Social Connections: Not on file  Intimate Partner Violence: Not on file    Review of Systems: See HPI, otherwise negative ROS  Physical Exam: BP 123/66   Pulse (!) 52   Temp 98.1 F (36.7 C) (Oral)   Resp 18   Ht '5\' 5"'$  (1.651 m)   Wt 74.8 kg   SpO2 99%   BMI 27.46 kg/m  General:   Alert,  Well-developed, well-nourished, pleasant and cooperative in NAD Lungs:  Clear throughout to auscultation.   No wheezes, crackles, or rhonchi. No acute distress. Heart:  Regular rate and rhythm; no murmurs, clicks, rubs,  or gallops. Abdomen:  Soft, nontender and nondistended. No masses, hepatosplenomegaly or hernias noted. Normal bowel sounds, without guarding, and without rebound.    Impression/Plan: Julian Wilson is now here to undergo a screening colonoscopy.  Average risk screening examination  Risks, benefits, limitations, imponderables and alternatives regarding colonoscopy have been reviewed with the patient. Questions have been answered. All parties agreeable.     Notice:  This dictation was prepared with Dragon dictation along with smaller phrase technology. Any transcriptional errors that result from this process are unintentional and may not be  corrected upon review.

## 2021-11-29 NOTE — Transfer of Care (Signed)
Immediate Anesthesia Transfer of Care Note  Patient: DAVARIUS RIDENER  Procedure(s) Performed: COLONOSCOPY WITH PROPOFOL  Patient Location: Endoscopy Unit  Anesthesia Type:General  Level of Consciousness: awake  Airway & Oxygen Therapy: Patient Spontanous Breathing  Post-op Assessment: Report given to RN and Post -op Vital signs reviewed and stable  Post vital signs: Reviewed and stable  Last Vitals:  Vitals Value Taken Time  BP 81/37 11/29/21 1214  Temp 36.5 C 11/29/21 1214  Pulse 52 11/29/21 1214  Resp 18 11/29/21 1214  SpO2 98 % 11/29/21 1214    Last Pain:  Vitals:   11/29/21 1214  TempSrc: Oral  PainSc:       Patients Stated Pain Goal: 9 (70/96/43 8381)  Complications: No notable events documented.

## 2021-11-29 NOTE — Anesthesia Procedure Notes (Signed)
Date/Time: 11/29/2021 11:55 AM  Performed by: Vista Deck, CRNAPre-anesthesia Checklist: Patient identified, Emergency Drugs available, Suction available, Timeout performed and Patient being monitored Patient Re-evaluated:Patient Re-evaluated prior to induction Oxygen Delivery Method: Nasal Cannula

## 2021-11-29 NOTE — Discharge Instructions (Addendum)
  Colonoscopy Discharge Instructions  Read the instructions outlined below and refer to this sheet in the next few weeks. These discharge instructions provide you with general information on caring for yourself after you leave the hospital. Your doctor may also give you specific instructions. While your treatment has been planned according to the most current medical practices available, unavoidable complications occasionally occur. If you have any problems or questions after discharge, call Dr. Gala Romney at [phone]. ACTIVITY You may resume your regular activity, but move at a slower pace for the next 24 hours.  Take frequent rest periods for the next 24 hours.  Walking will help get rid of the air and reduce the bloated feeling in your belly (abdomen).  No driving for 24 hours (because of the medicine (anesthesia) used during the test).   Do not sign any important legal documents or operate any machinery for 24 hours (because of the anesthesia used during the test).  NUTRITION Drink plenty of fluids.  You may resume your normal diet as instructed by your doctor.  Begin with a light meal and progress to your normal diet. Heavy or fried foods are harder to digest and may make you feel sick to your stomach (nauseated).  Avoid alcoholic beverages for 24 hours or as instructed.  MEDICATIONS You may resume your normal medications unless your doctor tells you otherwise.  WHAT YOU CAN EXPECT TODAY Some feelings of bloating in the abdomen.  Passage of more gas than usual.  Spotting of blood in your stool or on the toilet paper.  IF YOU HAD POLYPS REMOVED DURING THE COLONOSCOPY: No aspirin products for 7 days or as instructed.  No alcohol for 7 days or as instructed.  Eat a soft diet for the next 24 hours.  FINDING OUT THE RESULTS OF YOUR TEST Not all test results are available during your visit. If your test results are not back during the visit, make an appointment with your caregiver to find out the  results. Do not assume everything is normal if you have not heard from your caregiver or the medical facility. It is important for you to follow up on all of your test results.  SEEK IMMEDIATE MEDICAL ATTENTION IF: You have more than a spotting of blood in your stool.  Your belly is swollen (abdominal distention).  You are nauseated or vomiting.  You have a temperature over 101.  You have abdominal pain or discomfort that is severe or gets worse throughout the day.     Your colon is once again normal!  It is recommended you return for 1 more screening colonoscopy in 10 years  At patient request, I called Patty at [phone] -reviewed findings and recommendations

## 2021-11-29 NOTE — Anesthesia Postprocedure Evaluation (Signed)
Anesthesia Post Note  Patient: Julian Wilson  Procedure(s) Performed: COLONOSCOPY WITH PROPOFOL  Patient location during evaluation: Phase II Anesthesia Type: General Level of consciousness: awake and alert and oriented Pain management: pain level controlled Vital Signs Assessment: post-procedure vital signs reviewed and stable Respiratory status: spontaneous breathing, nonlabored ventilation and respiratory function stable Cardiovascular status: blood pressure returned to baseline and stable Postop Assessment: no apparent nausea or vomiting Anesthetic complications: no   No notable events documented.   Last Vitals:  Vitals:   11/29/21 1214 11/29/21 1219  BP: (!) 81/37 (!) 92/54  Pulse: (!) 52 (!) 58  Resp: 18 19  Temp: 36.5 C   SpO2: 98% 100%    Last Pain:  Vitals:   11/29/21 1219  TempSrc:   PainSc: 0-No pain                 Deniece Rankin C Tishawna Larouche

## 2021-11-29 NOTE — Anesthesia Preprocedure Evaluation (Signed)
Anesthesia Evaluation  Patient identified by MRN, date of birth, ID band Patient awake    Reviewed: Allergy & Precautions, NPO status , Patient's Chart, lab work & pertinent test results  Airway Mallampati: I  TM Distance: >3 FB Neck ROM: Full    Dental  (+) Dental Advisory Given, Partial Upper, Partial Lower   Pulmonary neg pulmonary ROS,    Pulmonary exam normal breath sounds clear to auscultation       Cardiovascular negative cardio ROS Normal cardiovascular exam Rhythm:Regular Rate:Normal     Neuro/Psych negative neurological ROS  negative psych ROS   GI/Hepatic negative GI ROS, Neg liver ROS,   Endo/Other  negative endocrine ROS  Renal/GU negative Renal ROS  negative genitourinary   Musculoskeletal negative musculoskeletal ROS (+)   Abdominal   Peds negative pediatric ROS (+)  Hematology negative hematology ROS (+)   Anesthesia Other Findings   Reproductive/Obstetrics negative OB ROS                             Anesthesia Physical Anesthesia Plan  ASA: 2  Anesthesia Plan: General   Post-op Pain Management: Minimal or no pain anticipated   Induction: Intravenous  PONV Risk Score and Plan: Propofol infusion  Airway Management Planned: Nasal Cannula and Natural Airway  Additional Equipment:   Intra-op Plan:   Post-operative Plan:   Informed Consent: I have reviewed the patients History and Physical, chart, labs and discussed the procedure including the risks, benefits and alternatives for the proposed anesthesia with the patient or authorized representative who has indicated his/her understanding and acceptance.     Dental advisory given  Plan Discussed with: CRNA and Surgeon  Anesthesia Plan Comments:         Anesthesia Quick Evaluation

## 2021-11-29 NOTE — Op Note (Signed)
[facility] Patient Name: Julian Wilson Procedure Date: 11/29/2021 11:48 AM MRN: [id number] Date of Birth: [birth year] Attending MD: Norvel Richards , MD CSN: [id number] Age: 61 Admit Type: Outpatient Procedure:                Colonoscopy Indications:              Screening for colorectal malignant neoplasm Providers:                Norvel Richards, MD, Caprice Kluver, Raphael Gibney, Technician Referring MD:              Medicines:                Propofol per Anesthesia Complications:            No immediate complications. Estimated Blood Loss:     Estimated blood loss: none. Procedure:                Pre-Anesthesia Assessment:                           - Prior to the procedure, a History and Physical                            was performed, and patient medications and                            allergies were reviewed. The patient's tolerance of                            previous anesthesia was also reviewed. The risks                            and benefits of the procedure and the sedation                            options and risks were discussed with the patient.                            All questions were answered, and informed consent                            was obtained. Prior Anticoagulants: The patient has                            taken no previous anticoagulant or antiplatelet                            agents. ASA Grade Assessment: II - A patient with                            mild systemic disease. After reviewing the risks  and benefits, the patient was deemed in                            satisfactory condition to undergo the procedure.                           After obtaining informed consent, the colonoscope                            was passed under direct vision. Throughout the                            procedure, the patient's blood pressure, pulse, and                            oxygen  saturations were monitored continuously. The                            [id number]) scope was introduced through the                            anus and advanced to the the cecum, identified by                            appendiceal orifice and ileocecal valve. The                            colonoscopy was performed without difficulty. The                            patient tolerated the procedure well. The quality                            of the bowel preparation was adequate. The                            ileocecal valve, appendiceal orifice, and rectum                            were photographed. The ileocecal valve, appendiceal                            orifice, and rectum were photographed. The entire                            colon was well visualized. Scope In: 12:01:09 PM Scope Out: 12:10:42 PM Scope Withdrawal Time: 0 hours 6 minutes 25 seconds  Total Procedure Duration: 0 hours 9 minutes 33 seconds  Findings:      The perianal and digital rectal examinations were normal.      The colon (entire examined portion) appeared normal.      The retroflexed view of the distal rectum and anal verge was normal and       showed no anal or rectal abnormalities. Impression:               -  The entire examined colon is normal.                           - The distal rectum and anal verge are normal on                            retroflexion view.                           - No specimens collected. Moderate Sedation:      Moderate (conscious) sedation was personally administered by an       anesthesia professional. The following parameters were monitored: oxygen       saturation, heart rate, blood pressure, and response to care. Recommendation:           - Patient has a contact number available for                            emergencies. The signs and symptoms of potential                            delayed complications were discussed with the                             patient. Return to normal activities tomorrow.                            Written discharge instructions were provided to the                            patient.                           - Advance diet as tolerated.                           - Continue present medications.                           - Repeat colonoscopy in 10 years for screening                            purposes.                           - Return to GI office (date not yet determined). Procedure Code(s):        --- Professional ---                           [phone], Colonoscopy, flexible; diagnostic, including                            collection of specimen(s) by brushing or washing,                            when performed (separate procedure) Diagnosis Code(s):        ---  Professional ---                           Z12.11, Encounter for screening for malignant                            neoplasm of colon CPT copyright 2019 [employer]. All rights reserved. The codes documented in this report are preliminary and upon coder review may  be revised to meet current compliance requirements. Cristopher Estimable. Shantavia Jha, MD Norvel Richards, MD 11/29/2021 12:17:09 PM This report has been signed electronically. Number of Addenda: 0

## 2021-12-03 ENCOUNTER — Encounter (HOSPITAL_COMMUNITY): Payer: Self-pay | Admitting: Internal Medicine

## 2022-09-19 ENCOUNTER — Ambulatory Visit: Payer: Self-pay

## 2022-09-19 ENCOUNTER — Other Ambulatory Visit: Payer: Self-pay | Admitting: Nurse Practitioner

## 2022-09-19 DIAGNOSIS — M25562 Pain in left knee: Secondary | ICD-10-CM

## 2023-07-08 ENCOUNTER — Other Ambulatory Visit (HOSPITAL_COMMUNITY): Payer: Self-pay | Admitting: Family Medicine

## 2023-07-08 ENCOUNTER — Encounter (HOSPITAL_COMMUNITY): Payer: Self-pay | Admitting: Family Medicine

## 2023-07-08 DIAGNOSIS — M25561 Pain in right knee: Secondary | ICD-10-CM

## 2023-07-21 ENCOUNTER — Ambulatory Visit (HOSPITAL_COMMUNITY)
Admission: RE | Admit: 2023-07-21 | Discharge: 2023-07-21 | Disposition: A | Discharge status: Home / Self Care | Source: Ambulatory Visit | Attending: Family Medicine | Admitting: Family Medicine

## 2023-07-21 DIAGNOSIS — M25561 Pain in right knee: Secondary | ICD-10-CM | POA: Diagnosis present

## 2024-04-11 NOTE — Progress Notes (Unsigned)
 "     HPI: Julian Wilson has seen me previously @ [facility] in [place]--having underwent cystolithalopaxy of an 18 mm stone 3.22.2022.  3.3.2022: Cystolithalopaxy of a 16 mm bladder stone.   10.30.2022: TRUS/Bx. PSA 6.66. Prostate volume 48 mL. PSAD 0.14. 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   Prior PSA data: September/2021--8.23 February/2022--7.04 June/2022--9.39 August/2022--6.66 (17% free) June/2023-- 10.20.  This year--8.54  He denies significant lower urinary tract symptomatology.  He has had no blood in his urine. PMH: Past Medical History:  Diagnosis Date   Bladder calculus    Full dentures    History of kidney stones    Hyperlipidemia    Seasonal allergies     Surgical History: Past Surgical History:  Procedure Laterality Date   CARPAL TUNNEL RELEASE Bilateral 2019;  2020   CIRCUMCISION  2000 approx.   COLONOSCOPY  11/26/2011   Procedure: COLONOSCOPY;  Surgeon: Lamar CHRISTELLA Hollingshead, MD;  Location: [facility];  Service: Endoscopy;  Laterality: N/A;  12:30 PM   COLONOSCOPY WITH PROPOFOL  N/A 11/29/2021   Procedure: COLONOSCOPY WITH PROPOFOL ;  Surgeon: Hollingshead Lamar CHRISTELLA, MD;  Location: [facility];  Service: Endoscopy;  Laterality: N/A;  1:00pm, asa 2   CYSTOSCOPY WITH LITHOLAPAXY N/A 06/15/2020   Procedure: CYSTOSCOPY WITH LITHOLAPAXY;  Surgeon: Matilda Senior, MD;  Location: [facility];  Service: Urology;  Laterality: N/A;  45 MINS   HOLMIUM LASER APPLICATION N/A 06/15/2020   Procedure: HOLMIUM LASER APPLICATION;  Surgeon: Matilda Senior, MD;  Location: [facility];  Service: Urology;  Laterality: N/A;    Home Medications:  Allergies as of 04/13/2024   No Known Allergies      Medication List        Accurate as of April 11, 2024  7:29 PM. If you have any questions, ask your nurse or doctor.          aspirin 81 MG tablet Take 81 mg by mouth daily.   cetirizine 10 MG tablet Commonly known as:  ZYRTEC Take 10 mg by mouth at bedtime. Pt takes either this or an OTC allergy pill   naproxen sodium 220 MG tablet Commonly known as: ALEVE Take 220 mg by mouth daily as needed (back pain).   simvastatin 40 MG tablet Commonly known as: ZOCOR Take 40 mg by mouth every evening.   TYLENOL  500 MG tablet Generic drug: acetaminophen  Take 1,000 mg by mouth every 6 (six) hours as needed for mild pain.        Allergies: Allergies[1]  Family History: Family History  Problem Relation Age of Onset   Colon cancer Neg Hx     Social History:  reports that he has never smoked. He has never used smokeless tobacco. He reports that he does not drink alcohol and does not use drugs.  ROS: All other review of systems were reviewed and are negative except what is noted above in HPI  Physical Exam: There were no vitals taken for this visit.  Constitutional:  Alert and oriented, No acute distress. HEENT: [place] AT, moist mucus membranes.  Trachea midline, no masses. Cardiovascular: No clubbing, cyanosis, or edema. Respiratory: Normal respiratory effort, no increased work of breathing. Skin: No rashes, bruises or suspicious lesions. Neurologic: Grossly intact, no focal deficits, moving all 4 extremities. Psychiatric: Normal mood and affect.  Laboratory Data: Lab Results  Component Value Date   WBC 7.9 12/18/2018   HGB 14.8 12/18/2018   HCT 44.5 12/18/2018  MCV 89.2 12/18/2018   PLT 242 12/18/2018    Lab Results  Component Value Date   CREATININE 1.02 12/18/2018    Prior [facility] records reviewed  Prior PSA data reviewed  Prior hospital notes reviewed  Pathology reviewed   Assessment:  1.  Elevated PSA.  Negative biopsy in 2022.  PSA has been stable since that time  2.  BPH, minimal symptomatology  3.  History of bladder calculus, status post cystolitholapaxy in 2022.  No evidence of recurrence  Plan:   1.  I reassured Julian Wilson about his PSA data curve.  It has been stable over  the past few years  2.  I will work on setting up an MRI of his prostate just to make sure no abnormalities noted  3.  I will have him come back here in a year for recheck      [1] No Known Allergies  "

## 2024-04-13 ENCOUNTER — Ambulatory Visit: Payer: Self-pay | Admitting: Urology

## 2024-04-13 VITALS — BP 128/73 | HR 58

## 2024-04-13 DIAGNOSIS — R972 Elevated prostate specific antigen [PSA]: Secondary | ICD-10-CM | POA: Diagnosis not present

## 2024-04-13 DIAGNOSIS — Z87448 Personal history of other diseases of urinary system: Secondary | ICD-10-CM

## 2024-04-13 DIAGNOSIS — N401 Enlarged prostate with lower urinary tract symptoms: Secondary | ICD-10-CM

## 2024-04-13 DIAGNOSIS — N400 Enlarged prostate without lower urinary tract symptoms: Secondary | ICD-10-CM

## 2024-04-13 DIAGNOSIS — Z87442 Personal history of urinary calculi: Secondary | ICD-10-CM | POA: Diagnosis not present

## 2024-04-13 LAB — BLADDER SCAN AMB NON-IMAGING: Scan Result: 16

## 2024-04-13 NOTE — Progress Notes (Signed)
 Bladder Scan completed today due to reason of history of bladder stone   Patient cannot void prior to the bladder scan. Bladder scan result: 16  Performed By: Exie DASEN. CMA  Additional notes- Patient is scheduled to follow up with MD

## 2024-04-30 ENCOUNTER — Ambulatory Visit (HOSPITAL_COMMUNITY)
Admission: RE | Admit: 2024-04-30 | Discharge: 2024-04-30 | Disposition: A | Discharge status: Home / Self Care | Source: Ambulatory Visit | Attending: Urology | Admitting: Urology

## 2024-04-30 DIAGNOSIS — R972 Elevated prostate specific antigen [PSA]: Secondary | ICD-10-CM | POA: Diagnosis present

## 2024-04-30 MED ORDER — GADOBUTROL 1 MMOL/ML IV SOLN
7.5000 mL | Freq: Once | INTRAVENOUS | Status: AC | PRN
  Administered 2024-04-30: 7.5 mL via INTRAVENOUS

## 2024-05-10 ENCOUNTER — Ambulatory Visit: Payer: Self-pay | Admitting: Urology

## 2025-03-23 ENCOUNTER — Ambulatory Visit: Admitting: Urology
